# Patient Record
Sex: Female | Born: 1990 | Race: Black or African American | Hispanic: No | Marital: Single | Smoking: Never smoker
Health system: Southern US, Community
[De-identification: ages and names within clinical notes are randomized; demographics above are authoritative.]

## PROBLEM LIST (undated history)

## (undated) ENCOUNTER — Inpatient Hospital Stay (HOSPITAL_COMMUNITY): Payer: Self-pay

## (undated) DIAGNOSIS — J45909 Unspecified asthma, uncomplicated: Secondary | ICD-10-CM

## (undated) DIAGNOSIS — F1290 Cannabis use, unspecified, uncomplicated: Secondary | ICD-10-CM

## (undated) DIAGNOSIS — F319 Bipolar disorder, unspecified: Secondary | ICD-10-CM

## (undated) HISTORY — DX: Bipolar disorder, unspecified: F31.9

## (undated) HISTORY — DX: Cannabis use, unspecified, uncomplicated: F12.90

## (undated) HISTORY — PX: WISDOM TOOTH EXTRACTION: SHX21

---
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
## 2011-09-21 LAB — OB RESULTS CONSOLE RPR: RPR: NONREACTIVE

## 2011-09-21 LAB — OB RESULTS CONSOLE ANTIBODY SCREEN: Antibody Screen: NEGATIVE

## 2011-09-21 LAB — OB RESULTS CONSOLE GC/CHLAMYDIA: Chlamydia: NEGATIVE

## 2012-02-03 LAB — OB RESULTS CONSOLE GBS: GBS: NEGATIVE

## 2012-02-29 ENCOUNTER — Encounter (HOSPITAL_COMMUNITY): Payer: Self-pay

## 2012-02-29 ENCOUNTER — Inpatient Hospital Stay (HOSPITAL_COMMUNITY)
Admission: AD | Admit: 2012-02-29 | Discharge: 2012-02-29 | Disposition: A | Discharge status: Home / Self Care | Payer: Medicaid Other | Source: Ambulatory Visit | Attending: Obstetrics | Admitting: Obstetrics

## 2012-02-29 DIAGNOSIS — R109 Unspecified abdominal pain: Secondary | ICD-10-CM | POA: Insufficient documentation

## 2012-02-29 DIAGNOSIS — O99891 Other specified diseases and conditions complicating pregnancy: Secondary | ICD-10-CM | POA: Insufficient documentation

## 2012-02-29 HISTORY — DX: Unspecified asthma, uncomplicated: J45.909

## 2012-02-29 NOTE — MAU Note (Signed)
Dr. Clearance Coots notified pt in MAU for labor eval, having intermittent lower abdominal pain, noted mucus plug with blood tinge. EFM tracing reactive, cervix 1, long, -1, vertex, membranes intact, orders to d/c home with labor precautions.

## 2012-02-29 NOTE — MAU Note (Signed)
Pt states due date is today, notes mucus plug and blood tinge with mucus. Having pain in lower abdominal area as well. Denies gush of fluid, however has constant urge to void. Lower abd pain is intermittent.

## 2012-03-03 ENCOUNTER — Encounter (HOSPITAL_COMMUNITY): Payer: Self-pay | Admitting: *Deleted

## 2012-03-03 ENCOUNTER — Telehealth (HOSPITAL_COMMUNITY): Payer: Self-pay | Admitting: *Deleted

## 2012-03-03 ENCOUNTER — Other Ambulatory Visit: Payer: Self-pay | Admitting: Obstetrics

## 2012-03-03 NOTE — Telephone Encounter (Signed)
Preadmission screen  

## 2012-03-07 ENCOUNTER — Other Ambulatory Visit: Payer: Self-pay | Admitting: Obstetrics

## 2012-03-08 ENCOUNTER — Encounter (HOSPITAL_COMMUNITY): Payer: Self-pay

## 2012-03-08 ENCOUNTER — Inpatient Hospital Stay (HOSPITAL_COMMUNITY)
Admission: RE | Admit: 2012-03-08 | Discharge: 2012-03-12 | DRG: 766 | Disposition: A | Discharge status: Home / Self Care | Payer: Medicaid Other | Source: Ambulatory Visit | Attending: Obstetrics | Admitting: Obstetrics

## 2012-03-08 VITALS — BP 116/77 | HR 98 | Temp 97.3°F | Resp 20 | Ht 65.0 in | Wt 274.0 lb

## 2012-03-08 DIAGNOSIS — Z98891 History of uterine scar from previous surgery: Secondary | ICD-10-CM

## 2012-03-08 DIAGNOSIS — O3660X0 Maternal care for excessive fetal growth, unspecified trimester, not applicable or unspecified: Secondary | ICD-10-CM | POA: Diagnosis present

## 2012-03-08 DIAGNOSIS — O480 Post-term pregnancy: Principal | ICD-10-CM | POA: Diagnosis present

## 2012-03-08 DIAGNOSIS — IMO0001 Reserved for inherently not codable concepts without codable children: Secondary | ICD-10-CM

## 2012-03-08 LAB — CBC
MCH: 28.9 pg (ref 26.0–34.0)
MCHC: 34 g/dL (ref 30.0–36.0)
MCV: 85 fL (ref 78.0–100.0)
Platelets: 248 10*3/uL (ref 150–400)
RBC: 4.4 MIL/uL (ref 3.87–5.11)
RDW: 12.3 % (ref 11.5–15.5)

## 2012-03-08 LAB — TYPE AND SCREEN: Antibody Screen: NEGATIVE

## 2012-03-08 MED ORDER — OXYTOCIN 40 UNITS IN LACTATED RINGERS INFUSION - SIMPLE MED
1.0000 m[IU]/min | INTRAVENOUS | Status: DC
  Administered 2012-03-08: 1 m[IU]/min via INTRAVENOUS
  Filled 2012-03-08: qty 1000

## 2012-03-08 MED ORDER — NALBUPHINE HCL 10 MG/ML IJ SOLN: 10.0000 mg | INTRAMUSCULAR | Status: DC | PRN

## 2012-03-08 MED ORDER — NALBUPHINE HCL 10 MG/ML IJ SOLN: 10.0000 mg | Freq: Four times a day (QID) | INTRAMUSCULAR | Status: DC | PRN

## 2012-03-08 MED ORDER — IBUPROFEN 600 MG PO TABS: 600.0000 mg | ORAL_TABLET | Freq: Four times a day (QID) | ORAL | Status: DC | PRN

## 2012-03-08 MED ORDER — CITRIC ACID-SODIUM CITRATE 334-500 MG/5ML PO SOLN
30.0000 mL | ORAL | Status: DC | PRN
  Administered 2012-03-09: 30 mL via ORAL
  Filled 2012-03-08: qty 15

## 2012-03-08 MED ORDER — LACTATED RINGERS IV SOLN
INTRAVENOUS | Status: DC
  Administered 2012-03-08 – 2012-03-09 (×4): via INTRAVENOUS
  Administered 2012-03-09 (×2): 125 mL/h via INTRAVENOUS

## 2012-03-08 MED ORDER — PROMETHAZINE HCL 25 MG/ML IJ SOLN
25.0000 mg | Freq: Four times a day (QID) | INTRAMUSCULAR | Status: DC | PRN
  Administered 2012-03-09: 25 mg via INTRAMUSCULAR
  Filled 2012-03-08: qty 1

## 2012-03-08 MED ORDER — OXYTOCIN BOLUS FROM INFUSION
500.0000 mL | Freq: Once | INTRAVENOUS | Status: DC
  Filled 2012-03-08: qty 500

## 2012-03-08 MED ORDER — TERBUTALINE SULFATE 1 MG/ML IJ SOLN: 0.2500 mg | Freq: Once | INTRAMUSCULAR | Status: AC | PRN

## 2012-03-08 MED ORDER — ACETAMINOPHEN 325 MG PO TABS: 650.0000 mg | ORAL_TABLET | ORAL | Status: DC | PRN

## 2012-03-08 MED ORDER — LACTATED RINGERS IV SOLN
500.0000 mL | INTRAVENOUS | Status: DC | PRN
  Administered 2012-03-09: 500 mL via INTRAVENOUS
  Administered 2012-03-09: 200 mL via INTRAVENOUS

## 2012-03-08 MED ORDER — LIDOCAINE HCL (PF) 1 % IJ SOLN: 30.0000 mL | INTRAMUSCULAR | Status: DC | PRN

## 2012-03-08 MED ORDER — OXYTOCIN 40 UNITS IN LACTATED RINGERS INFUSION - SIMPLE MED: 62.5000 mL/h | INTRAVENOUS | Status: DC

## 2012-03-08 MED ORDER — ONDANSETRON HCL 4 MG/2ML IJ SOLN: 4.0000 mg | Freq: Four times a day (QID) | INTRAMUSCULAR | Status: DC | PRN

## 2012-03-08 MED ORDER — OXYCODONE-ACETAMINOPHEN 5-325 MG PO TABS: 1.0000 | ORAL_TABLET | ORAL | Status: DC | PRN

## 2012-03-08 NOTE — H&P (Signed)
Jean Dougherty is a 21 y.o. female presenting for IOL. Maternal Medical History:  Reason for admission: 21 yo G1 EDC 02-29-12.  Presents for IOL for postdates.  Fetal activity: Perceived fetal activity is normal.   Last perceived fetal movement was within the past hour.    Prenatal complications: no prenatal complications Prenatal Complications - Diabetes: none.    OB History    Grav Para Term Preterm Abortions TAB SAB Ect Mult Living   1              Past Medical History  Diagnosis Date  . Asthma    Past Surgical History  Procedure Date  . Wisdom tooth extraction    Family History: family history includes Cancer in her maternal grandmother; Diabetes in her father and maternal grandfather; and Hypertension in her father, maternal grandfather, and maternal uncle.  There is no history of Other. Social History:  reports that she has never smoked. She has never used smokeless tobacco. She reports that she does not drink alcohol or use illicit drugs.   Prenatal Transfer Tool  Maternal Diabetes: No Genetic Screening: Normal Maternal Ultrasounds/Referrals: Normal Fetal Ultrasounds or other Referrals:  None Maternal Substance Abuse:  No Significant Maternal Medications:  None Significant Maternal Lab Results:  Lab values include: Group B Strep negative Other Comments:  None  Review of Systems  All other systems reviewed and are negative.      Blood pressure 121/72, pulse 106, temperature 98.1 F (36.7 C), temperature source Oral, resp. rate 20, height 5\' 5"  (1.651 m), weight 124.286 kg (274 lb). Maternal Exam:  Abdomen: Patient reports no abdominal tenderness. Cervix: Cervix evaluated by digital exam.     Physical Exam  Nursing note and vitals reviewed. Constitutional: She is oriented to person, place, and time. She appears well-developed and well-nourished.  HENT:  Head: Normocephalic and atraumatic.  Eyes: Conjunctivae normal are normal. Pupils are equal, round, and  reactive to light.  Neck: Normal range of motion. Neck supple.  Cardiovascular: Normal rate and regular rhythm.   Respiratory: Effort normal and breath sounds normal.  GI: Soft.  Musculoskeletal: Normal range of motion.  Neurological: She is alert and oriented to person, place, and time.  Skin: Skin is warm and dry.  Psychiatric: She has a normal mood and affect. Her behavior is normal. Judgment and thought content normal.    Prenatal labs: ABO, Rh: A/Positive/-- (05/13 0000) Antibody: Negative (05/13 0000) Rubella: Immune (05/13 0000) RPR: Nonreactive (05/13 0000)  HBsAg: Negative (05/13 0000)  HIV: Non-reactive (05/13 0000)  GBS: Negative (09/25 0000)   Assessment/Plan: Postdates.  Low dose pitocin per protocol.   Sahian Kerney A 03/08/2012, 7:59 AM

## 2012-03-08 NOTE — Progress Notes (Signed)
Jean Dougherty is a 21 y.o. G1P0 at [redacted]w[redacted]d by LMP admitted for induction of labor due to Post dates. Due date 02-29-12.  Subjective:   Objective: BP 131/68  Pulse 88  Temp 98.1 F (36.7 C) (Oral)  Resp 20  Ht 5\' 5"  (1.651 m)  Wt 124.286 kg (274 lb)  BMI 45.60 kg/m2      FHT:  FHR: 150 bpm, variability: moderate,  accelerations:  Present,  decelerations:  Absent UC:   regular, every 3-5 minutes SVE:   Dilation: 2 Effacement (%): 90 Station: -2 Exam by:: [employer]: Lab Results  Component Value Date   WBC 6.1 03/08/2012   HGB 12.7 03/08/2012   HCT 37.4 03/08/2012   MCV 85.0 03/08/2012   PLT 248 03/08/2012    Assessment / Plan: Augmentation of labor, progressing well  Labor: Latent phase. Preeclampsia:  n/a Fetal Wellbeing:  Category I Pain Control:  Labor support without medications I/D:  n/a Anticipated MOD:  NSVD  Rayland Hamed A 03/08/2012, 9:10 AM

## 2012-03-09 ENCOUNTER — Encounter (HOSPITAL_COMMUNITY): Payer: Self-pay | Admitting: Anesthesiology

## 2012-03-09 ENCOUNTER — Inpatient Hospital Stay (HOSPITAL_COMMUNITY): Payer: Medicaid Other | Admitting: Anesthesiology

## 2012-03-09 ENCOUNTER — Encounter (HOSPITAL_COMMUNITY)
Admission: RE | Disposition: A | Discharge status: Home / Self Care | Payer: Self-pay | Source: Ambulatory Visit | Attending: Obstetrics

## 2012-03-09 ENCOUNTER — Encounter (HOSPITAL_COMMUNITY): Payer: Self-pay

## 2012-03-09 DIAGNOSIS — IMO0001 Reserved for inherently not codable concepts without codable children: Secondary | ICD-10-CM

## 2012-03-09 LAB — COMPREHENSIVE METABOLIC PANEL
ALT: 16 U/L (ref 0–35)
Alkaline Phosphatase: 154 U/L — ABNORMAL HIGH (ref 39–117)
BUN: 3 mg/dL — ABNORMAL LOW (ref 6–23)
CO2: 24 mEq/L (ref 19–32)
GFR calc Af Amer: >90 mL/min (ref >90)
GFR calc non Af Amer: >90 mL/min (ref >90)
Glucose, Bld: 116 mg/dL — ABNORMAL HIGH (ref 70–99)
Potassium: 4 mEq/L (ref 3.5–5.1)
Sodium: 136 mEq/L (ref 135–145)
Total Protein: 6.2 g/dL (ref 6.0–8.3)

## 2012-03-09 SURGERY — Surgical Case
Anesthesia: Epidural | Site: Abdomen | Wound class: Clean Contaminated

## 2012-03-09 MED ORDER — LIDOCAINE HCL 1.5 % IJ SOLN
INTRAMUSCULAR | Status: DC | PRN
  Administered 2012-03-09 (×2): 9 mL via INTRADERMAL

## 2012-03-09 MED ORDER — NALBUPHINE SYRINGE 5 MG/0.5 ML
10.0000 mg | INJECTION | Freq: Four times a day (QID) | INTRAMUSCULAR | Status: DC | PRN
  Administered 2012-03-09: 10 mg via INTRAMUSCULAR
  Filled 2012-03-09 (×2): qty 1

## 2012-03-09 MED ORDER — CARBOPROST TROMETHAMINE 250 MCG/ML IM SOLN
INTRAMUSCULAR | Status: AC
  Filled 2012-03-09: qty 1

## 2012-03-09 MED ORDER — HYDROMORPHONE HCL PF 1 MG/ML IJ SOLN
INTRAMUSCULAR | Status: AC
  Filled 2012-03-09: qty 1

## 2012-03-09 MED ORDER — KETOROLAC TROMETHAMINE 60 MG/2ML IM SOLN
INTRAMUSCULAR | Status: AC
  Filled 2012-03-09: qty 2

## 2012-03-09 MED ORDER — MEPERIDINE HCL 25 MG/ML IJ SOLN: 6.2500 mg | INTRAMUSCULAR | Status: DC | PRN

## 2012-03-09 MED ORDER — LIDOCAINE-EPINEPHRINE (PF) 2 %-1:200000 IJ SOLN
INTRAMUSCULAR | Status: AC
  Filled 2012-03-09: qty 20

## 2012-03-09 MED ORDER — LACTATED RINGERS IV SOLN
500.0000 mL | Freq: Once | INTRAVENOUS | Status: AC
  Administered 2012-03-09: 500 mL via INTRAVENOUS

## 2012-03-09 MED ORDER — OXYTOCIN 10 UNIT/ML IJ SOLN
INTRAMUSCULAR | Status: AC
  Filled 2012-03-09: qty 4

## 2012-03-09 MED ORDER — SCOPOLAMINE 1 MG/3DAYS TD PT72
MEDICATED_PATCH | TRANSDERMAL | Status: AC
  Filled 2012-03-09: qty 1

## 2012-03-09 MED ORDER — ONDANSETRON HCL 4 MG/2ML IJ SOLN
INTRAMUSCULAR | Status: DC | PRN
  Administered 2012-03-09: 4 mg via INTRAVENOUS

## 2012-03-09 MED ORDER — PROMETHAZINE HCL 25 MG/ML IJ SOLN: 6.2500 mg | INTRAMUSCULAR | Status: DC | PRN

## 2012-03-09 MED ORDER — FENTANYL 2.5 MCG/ML BUPIVACAINE 1/10 % EPIDURAL INFUSION (WH - ANES)
14.0000 mL/h | INTRAMUSCULAR | Status: DC
  Administered 2012-03-09 (×2): 14 mL/h via EPIDURAL
  Filled 2012-03-09 (×3): qty 125

## 2012-03-09 MED ORDER — FENTANYL 2.5 MCG/ML BUPIVACAINE 1/10 % EPIDURAL INFUSION (WH - ANES)
INTRAMUSCULAR | Status: DC | PRN
  Administered 2012-03-09: 14 mL/h via EPIDURAL

## 2012-03-09 MED ORDER — SODIUM CHLORIDE 0.9 % IV SOLN
3.0000 g | Freq: Once | INTRAVENOUS | Status: DC
  Filled 2012-03-09: qty 3

## 2012-03-09 MED ORDER — MORPHINE SULFATE (PF) 0.5 MG/ML IJ SOLN
INTRAMUSCULAR | Status: DC | PRN
  Administered 2012-03-09: 3 mg via EPIDURAL

## 2012-03-09 MED ORDER — DEXTROSE 5 % IV SOLN
3.0000 g | Freq: Once | INTRAVENOUS | Status: AC
  Administered 2012-03-09: 3 g via INTRAVENOUS
  Filled 2012-03-09: qty 3000

## 2012-03-09 MED ORDER — SODIUM BICARBONATE 8.4 % IV SOLN
INTRAVENOUS | Status: DC | PRN
  Administered 2012-03-09 (×2): 5 mL via EPIDURAL

## 2012-03-09 MED ORDER — NALBUPHINE SYRINGE 5 MG/0.5 ML
10.0000 mg | INJECTION | INTRAMUSCULAR | Status: DC | PRN
  Administered 2012-03-09: 10 mg via INTRAVENOUS
  Filled 2012-03-09 (×2): qty 1

## 2012-03-09 MED ORDER — DEXAMETHASONE SODIUM PHOSPHATE 10 MG/ML IJ SOLN
INTRAMUSCULAR | Status: DC | PRN
  Administered 2012-03-09: 10 mg via INTRAVENOUS

## 2012-03-09 MED ORDER — PHENYLEPHRINE 40 MCG/ML (10ML) SYRINGE FOR IV PUSH (FOR BLOOD PRESSURE SUPPORT)
80.0000 ug | PREFILLED_SYRINGE | INTRAVENOUS | Status: DC | PRN
  Filled 2012-03-09: qty 5

## 2012-03-09 MED ORDER — SCOPOLAMINE 1 MG/3DAYS TD PT72
1.0000 | MEDICATED_PATCH | Freq: Once | TRANSDERMAL | Status: DC
  Administered 2012-03-09: 1.5 mg via TRANSDERMAL

## 2012-03-09 MED ORDER — KETOROLAC TROMETHAMINE 30 MG/ML IJ SOLN: 15.0000 mg | Freq: Once | INTRAMUSCULAR | Status: AC | PRN

## 2012-03-09 MED ORDER — HYDROMORPHONE HCL PF 1 MG/ML IJ SOLN
0.2500 mg | INTRAMUSCULAR | Status: DC | PRN
  Administered 2012-03-09: 0.5 mg via INTRAVENOUS

## 2012-03-09 MED ORDER — KETOROLAC TROMETHAMINE 60 MG/2ML IM SOLN
60.0000 mg | Freq: Once | INTRAMUSCULAR | Status: AC | PRN
  Administered 2012-03-09: 60 mg via INTRAMUSCULAR

## 2012-03-09 MED ORDER — ONDANSETRON HCL 4 MG/2ML IJ SOLN
INTRAMUSCULAR | Status: AC
  Filled 2012-03-09: qty 2

## 2012-03-09 MED ORDER — PHENYLEPHRINE 40 MCG/ML (10ML) SYRINGE FOR IV PUSH (FOR BLOOD PRESSURE SUPPORT): 80.0000 ug | PREFILLED_SYRINGE | INTRAVENOUS | Status: DC | PRN

## 2012-03-09 MED ORDER — ENOXAPARIN SODIUM 40 MG/0.4ML ~~LOC~~ SOLN
40.0000 mg | SUBCUTANEOUS | Status: DC
  Filled 2012-03-09: qty 0.4

## 2012-03-09 MED ORDER — MORPHINE SULFATE (PF) 0.5 MG/ML IJ SOLN
INTRAMUSCULAR | Status: DC | PRN
  Administered 2012-03-09: 2 mg via INTRAVENOUS

## 2012-03-09 MED ORDER — MORPHINE SULFATE 0.5 MG/ML IJ SOLN
INTRAMUSCULAR | Status: AC
  Filled 2012-03-09: qty 10

## 2012-03-09 MED ORDER — LACTATED RINGERS IV SOLN
INTRAVENOUS | Status: DC | PRN
  Administered 2012-03-09 (×3): via INTRAVENOUS

## 2012-03-09 MED ORDER — DEXAMETHASONE SODIUM PHOSPHATE 10 MG/ML IJ SOLN
INTRAMUSCULAR | Status: AC
  Filled 2012-03-09: qty 1

## 2012-03-09 MED ORDER — SODIUM BICARBONATE 8.4 % IV SOLN
INTRAVENOUS | Status: AC
  Filled 2012-03-09: qty 50

## 2012-03-09 MED ORDER — DIPHENHYDRAMINE HCL 50 MG/ML IJ SOLN
12.5000 mg | INTRAMUSCULAR | Status: DC | PRN
  Administered 2012-03-09: 12.5 mg via INTRAVENOUS
  Filled 2012-03-09: qty 1

## 2012-03-09 MED ORDER — EPHEDRINE 5 MG/ML INJ
10.0000 mg | INTRAVENOUS | Status: DC | PRN
  Filled 2012-03-09: qty 4

## 2012-03-09 MED ORDER — EPHEDRINE 5 MG/ML INJ: 10.0000 mg | INTRAVENOUS | Status: DC | PRN

## 2012-03-09 MED ORDER — CARBOPROST TROMETHAMINE 250 MCG/ML IM SOLN
INTRAMUSCULAR | Status: DC | PRN
  Administered 2012-03-09: 250 ug via INTRAMUSCULAR

## 2012-03-09 MED ORDER — LACTATED RINGERS IV SOLN
40.0000 [IU] | INTRAVENOUS | Status: DC | PRN
  Administered 2012-03-09: 40 [IU] via INTRAVENOUS

## 2012-03-09 SURGICAL SUPPLY — 45 items
APL SKNCLS STERI-STRIP NONHPOA (GAUZE/BANDAGES/DRESSINGS)
BENZOIN TINCTURE PRP APPL 2/3 (GAUZE/BANDAGES/DRESSINGS) ×1 IMPLANT
CANISTER WOUND CARE 500ML ATS (WOUND CARE) IMPLANT
CLOTH BEACON ORANGE TIMEOUT ST (SAFETY) ×2 IMPLANT
CONTAINER PREFILL 10% NBF 15ML (MISCELLANEOUS) IMPLANT
DRAPE SURG 17X23 STRL (DRAPES) ×1 IMPLANT
DRESSING TELFA 8X3 (GAUZE/BANDAGES/DRESSINGS) ×1 IMPLANT
DRSG COVADERM 4X10 (GAUZE/BANDAGES/DRESSINGS) ×1 IMPLANT
DRSG VAC ATS LRG SENSATRAC (GAUZE/BANDAGES/DRESSINGS) IMPLANT
DRSG VAC ATS MED SENSATRAC (GAUZE/BANDAGES/DRESSINGS) IMPLANT
DRSG VAC ATS SM SENSATRAC (GAUZE/BANDAGES/DRESSINGS) IMPLANT
DURAPREP 26ML APPLICATOR (WOUND CARE) ×2 IMPLANT
ELECT REM PT RETURN 9FT ADLT (ELECTROSURGICAL) ×2
ELECTRODE REM PT RTRN 9FT ADLT (ELECTROSURGICAL) ×1 IMPLANT
EXTRACTOR VACUUM M CUP 4 TUBE (SUCTIONS) IMPLANT
GAUZE SPONGE 4X4 12PLY STRL LF (GAUZE/BANDAGES/DRESSINGS) ×2 IMPLANT
GLOVE BIO SURGEON STRL SZ 6.5 (GLOVE) ×4 IMPLANT
GOWN PREVENTION PLUS LG XLONG (DISPOSABLE) ×5 IMPLANT
KIT ABG SYR 3ML LUER SLIP (SYRINGE) IMPLANT
NDL HYPO 25X5/8 SAFETYGLIDE (NEEDLE) ×1 IMPLANT
NEEDLE HYPO 25X5/8 SAFETYGLIDE (NEEDLE) IMPLANT
NS IRRIG 1000ML POUR BTL (IV SOLUTION) ×2 IMPLANT
PACK C SECTION WH (CUSTOM PROCEDURE TRAY) ×2 IMPLANT
PAD ABD 7.5X8 STRL (GAUZE/BANDAGES/DRESSINGS) ×2 IMPLANT
PAD OB MATERNITY 4.3X12.25 (PERSONAL CARE ITEMS) ×1 IMPLANT
RTRCTR C-SECT PINK 25CM LRG (MISCELLANEOUS) ×1 IMPLANT
SLEEVE SCD COMPRESS KNEE MED (MISCELLANEOUS) ×1 IMPLANT
STAPLER VISISTAT 35W (STAPLE) IMPLANT
STRIP CLOSURE SKIN 1/2X4 (GAUZE/BANDAGES/DRESSINGS) ×1 IMPLANT
SUT MNCRL 0 VIOLET CTX 36 (SUTURE) ×2 IMPLANT
SUT MNCRL AB 3-0 PS2 27 (SUTURE) IMPLANT
SUT MONOCRYL 0 CTX 36 (SUTURE) ×3
SUT PDS AB 0 CTX 36 PDP370T (SUTURE) ×2 IMPLANT
SUT PLAIN 0 NONE (SUTURE) IMPLANT
SUT PLAIN 2 0 XLH (SUTURE) ×2 IMPLANT
SUT VIC AB 0 CTXB 36 (SUTURE) IMPLANT
SUT VIC AB 2-0 CT1 (SUTURE) ×2 IMPLANT
SUT VIC AB 2-0 CT1 27 (SUTURE) ×2
SUT VIC AB 2-0 CT1 TAPERPNT 27 (SUTURE) ×1 IMPLANT
SUT VIC AB 2-0 SH 27 (SUTURE)
SUT VIC AB 2-0 SH 27XBRD (SUTURE) IMPLANT
TAPE CLOTH SURG 4X10 WHT LF (GAUZE/BANDAGES/DRESSINGS) ×1 IMPLANT
TOWEL OR 17X24 6PK STRL BLUE (TOWEL DISPOSABLE) ×4 IMPLANT
TRAY FOLEY CATH 14FR (SET/KITS/TRAYS/PACK) ×2 IMPLANT
WATER STERILE IRR 1000ML POUR (IV SOLUTION) ×1 IMPLANT

## 2012-03-09 NOTE — Consult Note (Signed)
Neonatology Note:  Attendance at C-section:  I was asked to attend this primary C/S at 41 2/7 weeks due to FTP, failed induction. The mother is a G1P0 A pos, GBS neg with mild PIH and obesity. ROM 14 hours prior to delivery, fluid clear. Infant vigorous with good spontaneous cry and tone. Needed no bulb suctioning. Ap 9/9. Lungs clear to ausc in DR. Marked molding of the head noted. To CN to care of Pediatrician.  Walther Sanagustin, MD  

## 2012-03-09 NOTE — Progress Notes (Signed)
DORTHIE SANTINI is a 21 y.o. G1P0000 at [redacted]w[redacted]d by LMP admitted for induction of labor due to Post dates.   Subjective: Comfortable  Objective: BP 162/76  Pulse 106  Temp 98 F (36.7 C) (Oral)  Resp 18  Ht 5\' 5"  (1.651 m)  Wt 124.286 kg (274 lb)  BMI 45.60 kg/m2  SpO2 100% I/O last 3 completed shifts: In: -  Out: 1150 [Urine:1150] Total I/O In: -  Out: 1500 [Urine:1500]  FHT:  FHR: 140 bpm, variability: moderate,  accelerations:  Present,  decelerations:  Absent UC:   irregular, every 5 minutes SVE:   Dilation: 4.5 Effacement (%): 90 Station: -1 Exam by:: Dr. Tamela Oddi  Labs: Lab Results  Component Value Date   WBC 6.1 03/08/2012   HGB 12.7 03/08/2012   HCT 37.4 03/08/2012   MCV 85.0 03/08/2012   PLT 248 03/08/2012    Assessment / Plan: Protracted latent phase Inadequate MVU; coupling--dysfunctional pattern ?LGA  Labor: see above Preeclampsia:  B/Ps labile; labs OK; no symptoms Fetal Wellbeing:  Category I Pain Control:  Epidural I/D:  n/a Anticipated MOD:  C/D  JACKSON-MOORE,Stefan Karen A 03/09/2012, 8:31 PM

## 2012-03-09 NOTE — Anesthesia Postprocedure Evaluation (Signed)
Anesthesia Post Note  Patient: Jean Dougherty  Procedure(s) Performed: Procedure(s) (LRB): CESAREAN SECTION (N/A)  Anesthesia type: Epidural  Patient location: PACU  Post pain: Pain level controlled  Post assessment: Post-op Vital signs reviewed  Last Vitals:  Filed Vitals:   03/09/12 2020  BP: 138/89  Pulse: 122  Temp:   Resp:     Post vital signs: Reviewed  Level of consciousness: awake  Complications: No apparent anesthesia complications

## 2012-03-09 NOTE — Transfer of Care (Signed)
Immediate Anesthesia Transfer of Care Note  Patient: Jean Dougherty  Procedure(s) Performed: Procedure(s) (LRB) with comments: CESAREAN SECTION (N/A)  Patient Location: PACU  Anesthesia Type:Epidural  Level of Consciousness: awake  Airway & Oxygen Therapy: Patient Spontanous Breathing  Post-op Assessment: Report given to PACU RN and Post -op Vital signs reviewed and stable  Post vital signs: stable  Complications: No apparent anesthesia complications

## 2012-03-09 NOTE — Op Note (Signed)
Cesarean Section Procedure Note   Jean Dougherty   03/08/2012 - 03/09/2012  Indications: Protracted latent phase, failed induction of labor attempt   Pre-operative Diagnosis: Protracted latent phase, failed induction of labor attempt  Post-operative Diagnosis: Same   Surgeon: Antionette Char A  Assistants: none  Anesthesia: epidural  Procedure Details:  The patient was seen in the Holding Room. The risks, benefits, complications, treatment options, and expected outcomes were discussed with the patient. The patient concurred with the proposed plan, giving informed consent. The patient was identified as Jean Dougherty and the procedure verified as C-Section Delivery. A Time Out was held and the above information confirmed.  After induction of anesthesia, the patient was draped and prepped in the usual sterile manner. A transverse incision was made and carried down through the subcutaneous tissue to the fascia. The fascial incision was made and extended transversely. The fascia was separated from the underlying rectus tissue superiorly and inferiorly. The peritoneum was identified and entered. The peritoneal incision was extended longitudinally. The utero-vesical peritoneal reflection was incised transversely and the bladder flap was bluntly freed from the lower uterine segment. A low transverse uterine incision was made. Delivered from cephalic presentation was a  living newborn female infant(s). APGAR (1 MIN): 9   APGAR (5 MINS): 9      A cord ph was not sent. The umbilical cord was clamped and cut cord. A sample was obtained for evaluation. The placenta was removed Intact and appeared normal.  The uterus was exteriorized.  There was mild uterine atony that responed to fundal massage an IM hemabate.  The uterine incision was closed with running locked sutures of 1-0 Monocryl. A second imbricating layer of the same suture was placed.  Hemostasis was observed. The paracolic gutters were  irrigated. The parieto peritoneum was closed in a running fashion with 2-0 Vicryl.  The fascia was then reapproximated with running sutures of 0 PDS.  Interrupted 0 Plain sutures were placed in the subcutaneous layer.  The skin was closed with staples.  Instrument, sponge, and needle counts were correct prior the abdominal closure and were correct at the conclusion of the case.    Findings:  The position of the fetal head was occiput posterior.  There was significant molding.   Estimated Blood Loss: 600 ml  Total IV Fluids: per Anesthesiology  Urine Output: per Anesthesiology  Specimens: none  Complications: no complications  Disposition: PACU - hemodynamically stable.  Maternal Condition: stable   Baby condition / location:  nursery-stable    Signed: Surgeon(s): Antionette Char, MD

## 2012-03-09 NOTE — Progress Notes (Signed)
Orders received to stop pitocin for 2 hours then restart at 51mu/min.  Will continue to monitor.

## 2012-03-09 NOTE — Anesthesia Procedure Notes (Signed)
Epidural Patient location during procedure: OB Start time: 03/09/2012 5:27 AM End time: 03/09/2012 5:32 AM  Staffing Anesthesiologist: Sandrea Hughs Performed by: anesthesiologist   Preanesthetic Checklist Completed: patient identified, site marked, surgical consent, pre-op evaluation, timeout performed, IV checked, risks and benefits discussed and monitors and equipment checked  Epidural Patient position: sitting Prep: site prepped and draped and DuraPrep Patient monitoring: continuous pulse ox and blood pressure Approach: midline Injection technique: LOR air  Needle:  Needle type: Tuohy  Needle gauge: 17 G Needle length: 9 cm and 9 Needle insertion depth: 9 cm Catheter type: closed end flexible Catheter size: 19 Gauge Catheter at skin depth: 15 cm Test dose: negative and Other  Assessment Sensory level: T8 Events: blood not aspirated, injection not painful, no injection resistance, negative IV test and no paresthesia  Additional Notes Reason for block:procedure for pain

## 2012-03-09 NOTE — Progress Notes (Signed)
Jean Dougherty is a 21 y.o. G1P0000 at [redacted]w[redacted]d by LMP admitted for induction of labor due to Post dates.   Subjective: Comfortable  Objective: BP 125/55  Pulse 106  Temp 98.2 F (36.8 C) (Oral)  Resp 18  Ht 5\' 5"  (1.651 m)  Wt 124.286 kg (274 lb)  BMI 45.60 kg/m2  SpO2 100%   Total I/O In: -  Out: 250 [Urine:250]  FHT:  FHR: 140 bpm, variability: moderate,  accelerations:  Present,  decelerations:  Absent UC:   irregular, every 5 minutes SVE:   Dilation: 4.5 Effacement (%): 100 Station: -1 Exam by:: Valentina Lucks, RN  Labs: Lab Results  Component Value Date   WBC 6.1 03/08/2012   HGB 12.7 03/08/2012   HCT 37.4 03/08/2012   MCV 85.0 03/08/2012   PLT 248 03/08/2012    Assessment / Plan: Induction of labor due to postterm,  progressing well on pitocin  Labor: Progressing normally Preeclampsia:  B/Ps OK; check labs Fetal Wellbeing:  Category I Pain Control:  Epidural I/D:  n/a Anticipated MOD:  NSVD  JACKSON-MOORE,Terryl Molinelli A 03/09/2012, 8:52 AM

## 2012-03-09 NOTE — Progress Notes (Signed)
Jean Dougherty is a 21 y.o. G1P0000 at [redacted]w[redacted]d by LMP admitted for induction of labor due to Post dates. Due date 02-29-12.  Subjective:   Objective: BP 144/81  Pulse 101  Temp 98.3 F (36.8 C) (Oral)  Resp 18  Ht 5\' 5"  (1.651 m)  Wt 124.286 kg (274 lb)  BMI 45.60 kg/m2      FHT:  FHR: 150-160 bpm, variability: moderate,  accelerations:  Present,  decelerations:  Absent UC:   regular, every 3 minutes SVE:   Dilation: 3 Effacement (%): 100 Station: -2 Exam by:: N Deal RN  Labs: Lab Results  Component Value Date   WBC 6.1 03/08/2012   HGB 12.7 03/08/2012   HCT 37.4 03/08/2012   MCV 85.0 03/08/2012   PLT 248 03/08/2012    Assessment / Plan: Postdates.  Latent phase.  Continue pitocin per low dose protocol.  Labor: Latent phase. Preeclampsia:  n/a Fetal Wellbeing:  Category I Pain Control:  Nubain I/D:  n/a Anticipated MOD:  NSVD  Jean Dougherty A 03/09/2012, 1:50 AM

## 2012-03-09 NOTE — Anesthesia Preprocedure Evaluation (Signed)
Anesthesia Evaluation  Patient identified by MRN, date of birth, ID band Patient awake    Reviewed: Allergy & Precautions, H&P , NPO status , Patient's Chart, lab work & pertinent test results  Airway Mallampati: III TM Distance: >3 FB Neck ROM: full    Dental No notable dental hx.    Pulmonary neg pulmonary ROS,    Pulmonary exam normal       Cardiovascular negative cardio ROS      Neuro/Psych negative neurological ROS  negative psych ROS   GI/Hepatic negative GI ROS, Neg liver ROS,   Endo/Other  Morbid obesity  Renal/GU negative Renal ROS  negative genitourinary   Musculoskeletal negative musculoskeletal ROS (+)   Abdominal (+) + obese,   Peds negative pediatric ROS (+)  Hematology negative hematology ROS (+)   Anesthesia Other Findings   Reproductive/Obstetrics (+) Pregnancy                           Anesthesia Physical Anesthesia Plan  ASA: III  Anesthesia Plan: Epidural   Post-op Pain Management:    Induction:   Airway Management Planned:   Additional Equipment:   Intra-op Plan:   Post-operative Plan:   Informed Consent: I have reviewed the patients History and Physical, chart, labs and discussed the procedure including the risks, benefits and alternatives for the proposed anesthesia with the patient or authorized representative who has indicated his/her understanding and acceptance.     Plan Discussed with:   Anesthesia Plan Comments:         Anesthesia Quick Evaluation  

## 2012-03-10 ENCOUNTER — Encounter (HOSPITAL_COMMUNITY): Payer: Self-pay

## 2012-03-10 LAB — COMPREHENSIVE METABOLIC PANEL
ALT: 12 U/L (ref 0–35)
AST: 25 U/L (ref 0–37)
Calcium: 9.1 mg/dL (ref 8.4–10.5)
Sodium: 130 mEq/L — ABNORMAL LOW (ref 135–145)
Total Protein: 5.6 g/dL — ABNORMAL LOW (ref 6.0–8.3)

## 2012-03-10 LAB — CBC
MCH: 30.6 pg (ref 26.0–34.0)
MCHC: 35.7 g/dL (ref 30.0–36.0)
Platelets: 204 10*3/uL (ref 150–400)
RBC: 3.56 MIL/uL — ABNORMAL LOW (ref 3.87–5.11)

## 2012-03-10 MED ORDER — NALBUPHINE SYRINGE 5 MG/0.5 ML
5.0000 mg | INJECTION | INTRAMUSCULAR | Status: DC | PRN
  Filled 2012-03-10: qty 1

## 2012-03-10 MED ORDER — DIPHENHYDRAMINE HCL 25 MG PO CAPS: 25.0000 mg | ORAL_CAPSULE | Freq: Four times a day (QID) | ORAL | Status: DC | PRN

## 2012-03-10 MED ORDER — DIPHENHYDRAMINE HCL 50 MG/ML IJ SOLN: 25.0000 mg | INTRAMUSCULAR | Status: DC | PRN

## 2012-03-10 MED ORDER — MEASLES, MUMPS & RUBELLA VAC ~~LOC~~ INJ
0.5000 mL | INJECTION | Freq: Once | SUBCUTANEOUS | Status: DC
  Filled 2012-03-10: qty 0.5

## 2012-03-10 MED ORDER — PRENATAL MULTIVITAMIN CH
1.0000 | ORAL_TABLET | Freq: Every day | ORAL | Status: DC
  Administered 2012-03-10 – 2012-03-12 (×3): 1 via ORAL
  Filled 2012-03-10 (×3): qty 1

## 2012-03-10 MED ORDER — NALBUPHINE SYRINGE 5 MG/0.5 ML
5.0000 mg | INJECTION | INTRAMUSCULAR | Status: DC | PRN
  Administered 2012-03-10: 5 mg via INTRAVENOUS
  Filled 2012-03-10 (×2): qty 1

## 2012-03-10 MED ORDER — IBUPROFEN 600 MG PO TABS
600.0000 mg | ORAL_TABLET | Freq: Four times a day (QID) | ORAL | Status: DC
  Administered 2012-03-10 – 2012-03-12 (×10): 600 mg via ORAL
  Filled 2012-03-10 (×10): qty 1

## 2012-03-10 MED ORDER — KETOROLAC TROMETHAMINE 30 MG/ML IJ SOLN: 30.0000 mg | Freq: Four times a day (QID) | INTRAMUSCULAR | Status: AC | PRN

## 2012-03-10 MED ORDER — TETANUS-DIPHTH-ACELL PERTUSSIS 5-2.5-18.5 LF-MCG/0.5 IM SUSP
0.5000 mL | Freq: Once | INTRAMUSCULAR | Status: AC
  Administered 2012-03-10: 0.5 mL via INTRAMUSCULAR

## 2012-03-10 MED ORDER — ENOXAPARIN SODIUM 40 MG/0.4ML ~~LOC~~ SOLN
40.0000 mg | SUBCUTANEOUS | Status: DC
  Filled 2012-03-10 (×2): qty 0.4

## 2012-03-10 MED ORDER — DIPHENHYDRAMINE HCL 25 MG PO CAPS
25.0000 mg | ORAL_CAPSULE | ORAL | Status: DC | PRN
  Administered 2012-03-10: 25 mg via ORAL
  Filled 2012-03-10: qty 1

## 2012-03-10 MED ORDER — OXYTOCIN 40 UNITS IN LACTATED RINGERS INFUSION - SIMPLE MED: 62.5000 mL/h | INTRAVENOUS | Status: AC

## 2012-03-10 MED ORDER — DEXTROSE IN LACTATED RINGERS 5 % IV SOLN
INTRAVENOUS | Status: DC
  Administered 2012-03-10: 05:00:00 via INTRAVENOUS

## 2012-03-10 MED ORDER — ONDANSETRON HCL 4 MG/2ML IJ SOLN: 4.0000 mg | INTRAMUSCULAR | Status: DC | PRN

## 2012-03-10 MED ORDER — SODIUM CHLORIDE 0.9 % IJ SOLN: 3.0000 mL | INTRAMUSCULAR | Status: DC | PRN

## 2012-03-10 MED ORDER — SIMETHICONE 80 MG PO CHEW
80.0000 mg | CHEWABLE_TABLET | ORAL | Status: DC | PRN
  Administered 2012-03-10 – 2012-03-11 (×4): 80 mg via ORAL

## 2012-03-10 MED ORDER — DIBUCAINE 1 % RE OINT: 1.0000 "application " | TOPICAL_OINTMENT | RECTAL | Status: DC | PRN

## 2012-03-10 MED ORDER — WITCH HAZEL-GLYCERIN EX PADS: 1.0000 "application " | MEDICATED_PAD | CUTANEOUS | Status: DC | PRN

## 2012-03-10 MED ORDER — FERROUS SULFATE 325 (65 FE) MG PO TABS
325.0000 mg | ORAL_TABLET | Freq: Two times a day (BID) | ORAL | Status: DC
  Administered 2012-03-10 – 2012-03-12 (×4): 325 mg via ORAL
  Filled 2012-03-10 (×3): qty 1

## 2012-03-10 MED ORDER — LANOLIN HYDROUS EX OINT: 1.0000 "application " | TOPICAL_OINTMENT | CUTANEOUS | Status: DC | PRN

## 2012-03-10 MED ORDER — ONDANSETRON HCL 4 MG/2ML IJ SOLN: 4.0000 mg | Freq: Three times a day (TID) | INTRAMUSCULAR | Status: DC | PRN

## 2012-03-10 MED ORDER — DIPHENHYDRAMINE HCL 50 MG/ML IJ SOLN
12.5000 mg | INTRAMUSCULAR | Status: DC | PRN
  Administered 2012-03-10: 12.5 mg via INTRAVENOUS
  Filled 2012-03-10: qty 1

## 2012-03-10 MED ORDER — PNEUMOCOCCAL VAC POLYVALENT 25 MCG/0.5ML IJ INJ
0.5000 mL | INJECTION | INTRAMUSCULAR | Status: AC
  Administered 2012-03-10: 0.5 mL via INTRAMUSCULAR
  Filled 2012-03-10: qty 0.5

## 2012-03-10 MED ORDER — OXYCODONE-ACETAMINOPHEN 5-325 MG PO TABS
1.0000 | ORAL_TABLET | ORAL | Status: DC | PRN
  Administered 2012-03-11 – 2012-03-12 (×6): 1 via ORAL
  Filled 2012-03-10 (×6): qty 1

## 2012-03-10 MED ORDER — ONDANSETRON HCL 4 MG PO TABS: 4.0000 mg | ORAL_TABLET | ORAL | Status: DC | PRN

## 2012-03-10 MED ORDER — MAGNESIUM HYDROXIDE 400 MG/5ML PO SUSP: 30.0000 mL | ORAL | Status: DC | PRN

## 2012-03-10 MED ORDER — METOCLOPRAMIDE HCL 5 MG/ML IJ SOLN: 10.0000 mg | Freq: Three times a day (TID) | INTRAMUSCULAR | Status: DC | PRN

## 2012-03-10 MED ORDER — NALOXONE HCL 0.4 MG/ML IJ SOLN: 0.4000 mg | INTRAMUSCULAR | Status: DC | PRN

## 2012-03-10 MED ORDER — SENNOSIDES-DOCUSATE SODIUM 8.6-50 MG PO TABS
2.0000 | ORAL_TABLET | Freq: Every day | ORAL | Status: DC
  Administered 2012-03-10 – 2012-03-11 (×2): 2 via ORAL

## 2012-03-10 MED ORDER — SODIUM CHLORIDE 0.9 % IV SOLN
1.0000 ug/kg/h | INTRAVENOUS | Status: DC | PRN
  Filled 2012-03-10: qty 2.5

## 2012-03-10 MED ORDER — LACTATED RINGERS IV SOLN: INTRAVENOUS | Status: DC

## 2012-03-10 MED ORDER — ZOLPIDEM TARTRATE 5 MG PO TABS: 5.0000 mg | ORAL_TABLET | Freq: Every evening | ORAL | Status: DC | PRN

## 2012-03-10 NOTE — Addendum Note (Signed)
Addendum  created 03/10/12 8119 by Suella Grove, CRNA   Modules edited:Notes Section

## 2012-03-10 NOTE — Progress Notes (Signed)
Subjective: Postpartum Day 1: Cesarean Delivery Patient reports incisional pain and tolerating PO.    Objective: Vital signs in last 24 hours: Temp:  [97.4 F (36.3 C)-98.9 F (37.2 C)] 98.3 F (36.8 C) (10/31 0500) Pulse Rate:  [92-143] 105  (10/31 0500) Resp:  [18-25] 20  (10/31 0500) BP: (104-166)/(30-117) 120/77 mmHg (10/31 0500) SpO2:  [95 %-100 %] 99 % (10/31 0500)  Physical Exam:  General: alert and no distress Lochia: appropriate Uterine Fundus: firm Incision: healing well DVT Evaluation: No evidence of DVT seen on physical exam.   Basename 03/10/12 0509 03/08/12 0740  HGB 10.9* 12.7  HCT 30.5* 37.4    Assessment/Plan: Status post Cesarean section. Doing well postoperatively.  Continue current care.  Trevino Wyatt A 03/10/2012, 6:04 AM

## 2012-03-10 NOTE — Anesthesia Postprocedure Evaluation (Signed)
  Anesthesia Post-op Note  Patient: Jean Dougherty  Procedure(s) Performed: Procedure(s) (LRB) with comments: CESAREAN SECTION (N/A)  Patient Location: Mother/Baby  Anesthesia Type:Epidural  Level of Consciousness: awake, alert  and oriented  Airway and Oxygen Therapy: Patient Spontanous Breathing  Post-op Pain: none  Post-op Assessment: Patient's Cardiovascular Status Stable, Respiratory Function Stable, Patent Airway, No signs of Nausea or vomiting, Adequate PO intake, Pain level controlled, No headache, No backache, No residual numbness and No residual motor weakness  Post-op Vital Signs: Reviewed and stable  Complications: No apparent anesthesia complications

## 2012-03-10 NOTE — Progress Notes (Signed)
UR Chart review completed.  

## 2012-03-11 MED ORDER — INFLUENZA VIRUS VACC SPLIT PF IM SUSP
0.5000 mL | INTRAMUSCULAR | Status: AC
  Administered 2012-03-12: 0.5 mL via INTRAMUSCULAR
  Filled 2012-03-11: qty 0.5

## 2012-03-11 NOTE — Progress Notes (Signed)
Subjective: Postpartum Day 2: Cesarean Delivery Patient reports incisional pain, tolerating PO, + flatus and no problems voiding.    Objective: Vital signs in last 24 hours: Temp:  [97.6 F (36.4 C)-98.4 F (36.9 C)] 98.4 F (36.9 C) (11/01 0643) Pulse Rate:  [68-110] 68  (11/01 0643) Resp:  [18-20] 18  (11/01 0643) BP: (95-134)/(62-75) 134/68 mmHg (11/01 0643) SpO2:  [97 %-100 %] 100 % (10/31 2209)  Physical Exam:  General: alert and no distress Lochia: appropriate Uterine Fundus: firm Incision: healing well DVT Evaluation: No evidence of DVT seen on physical exam.   Basename 03/10/12 0509  HGB 10.9*  HCT 30.5*    Assessment/Plan: Status post Cesarean section. Doing well postoperatively.  Continue current care.  Millena Callins A 03/11/2012, 8:40 AM

## 2012-03-12 NOTE — Discharge Summary (Signed)
Obstetric Discharge Summary Reason for Admission: onset of labor Prenatal Procedures: none Intrapartum Procedures: cesarean: low cervical, transverse Postpartum Procedures: none Complications-Operative and Postpartum: none Hemoglobin  Date Value Range Status  03/10/2012 10.9* 12.0 - 15.0 g/dL Final     HCT  Date Value Range Status  03/10/2012 30.5* 36.0 - 46.0 % Final    Physical Exam:  General: alert Lochia: appropriate Uterine Fundus: firm Incision: healing well DVT Evaluation: No evidence of DVT seen on physical exam.  Discharge Diagnoses: Term Pregnancy-delivered  Discharge Information: Date: 03/12/2012 Activity: pelvic rest Diet: routine Medications: Percocet Condition: stable Instructions: refer to practice specific booklet Discharge to: home Follow-up Information    Call in 6 weeks to follow up.         Newborn Data: Live born female  Birth Weight: 7 lb 6.9 oz (3370 g) APGAR: 9, 9  Home with mother.  MARSHALL,BERNARD A 03/12/2012, 6:58 AM

## 2012-10-27 ENCOUNTER — Encounter: Payer: Self-pay | Admitting: Obstetrics & Gynecology

## 2014-02-12 ENCOUNTER — Other Ambulatory Visit (INDEPENDENT_AMBULATORY_CARE_PROVIDER_SITE_OTHER): Payer: Medicaid Other

## 2014-02-12 VITALS — BP 128/63 | HR 79 | Temp 98.0°F | Ht 64.5 in | Wt 188.0 lb

## 2014-02-12 DIAGNOSIS — Z3200 Encounter for pregnancy test, result unknown: Secondary | ICD-10-CM

## 2014-02-12 LAB — POCT URINE PREGNANCY: PREG TEST UR: POSITIVE

## 2014-02-12 NOTE — Addendum Note (Signed)
Addended by: Odessa FlemingBOHNE, Jennipher Weatherholtz M on: 02/12/2014 05:04 PM   Modules accepted: Level of Service

## 2014-02-12 NOTE — Progress Notes (Signed)
Patient is in the office today for UPT. UPT preformed, Results were positive. LMP: 01/10/14, patient is 4 weeks and 5 days along. EDD: October 18, 2014. Patient states she was using protection but the condom broke. Patient asked if there was anything she could take. Patient given the list of abortion sites. Patient notified that should this ever happen again she should call the office and we could send her Plan B but that she has to call within the first 72 hours after sexual intercourse. Patient notified that this would prevent her from becoming pregnant. Patient notified that if she chose abortion and afterwards if she would like birth control to call the office and we would bring her in for a birth control consult. Patient also notified that if she did choose to keep the pregnancy to call the office and we would get her set up for her New OB appointment. Patient voiced understanding.   BP 128/63  Pulse 79  Temp(Src) 98 F (36.7 C)  Ht 5' 4.5" (1.638 m)  Wt 188 lb (85.276 kg)  BMI 31.78 kg/m2  LMP 01/10/2014  Results for orders placed in visit on 02/12/14 (from the past 24 hour(s))  POCT URINE PREGNANCY     Status: None   Collection Time    02/12/14  5:01 PM      Result Value Ref Range   Preg Test, Ur Positive

## 2014-09-21 ENCOUNTER — Emergency Department (HOSPITAL_COMMUNITY)
Admission: EM | Admit: 2014-09-21 | Discharge: 2014-09-21 | Disposition: A | Discharge status: Home / Self Care | Payer: Self-pay | Source: Home / Self Care

## 2014-09-21 NOTE — ED Notes (Signed)
Room call #3 at 1217.

## 2014-09-21 NOTE — ED Notes (Signed)
Room call #1 at 1157.

## 2014-09-21 NOTE — ED Notes (Signed)
Room call #2 at 1208.

## 2015-01-19 ENCOUNTER — Emergency Department (HOSPITAL_COMMUNITY)
Admission: EM | Admit: 2015-01-19 | Discharge: 2015-01-19 | Disposition: A | Discharge status: Home / Self Care | Payer: Medicaid Other | Attending: Emergency Medicine | Admitting: Emergency Medicine

## 2015-01-19 ENCOUNTER — Encounter (HOSPITAL_COMMUNITY): Payer: Self-pay | Admitting: *Deleted

## 2015-01-19 ENCOUNTER — Emergency Department (HOSPITAL_COMMUNITY): Payer: Medicaid Other

## 2015-01-19 DIAGNOSIS — O9989 Other specified diseases and conditions complicating pregnancy, childbirth and the puerperium: Secondary | ICD-10-CM | POA: Insufficient documentation

## 2015-01-19 DIAGNOSIS — R51 Headache: Secondary | ICD-10-CM | POA: Insufficient documentation

## 2015-01-19 DIAGNOSIS — R1030 Lower abdominal pain, unspecified: Secondary | ICD-10-CM | POA: Insufficient documentation

## 2015-01-19 DIAGNOSIS — O26899 Other specified pregnancy related conditions, unspecified trimester: Secondary | ICD-10-CM

## 2015-01-19 DIAGNOSIS — R109 Unspecified abdominal pain: Secondary | ICD-10-CM

## 2015-01-19 DIAGNOSIS — Z3A01 Less than 8 weeks gestation of pregnancy: Secondary | ICD-10-CM | POA: Insufficient documentation

## 2015-01-19 DIAGNOSIS — J45909 Unspecified asthma, uncomplicated: Secondary | ICD-10-CM | POA: Insufficient documentation

## 2015-01-19 DIAGNOSIS — O99511 Diseases of the respiratory system complicating pregnancy, first trimester: Secondary | ICD-10-CM | POA: Insufficient documentation

## 2015-01-19 LAB — CBC WITH DIFFERENTIAL/PLATELET
BASOS ABS: 0 10*3/uL (ref 0.0–0.1)
BASOS PCT: 1 % (ref 0–1)
EOS ABS: 0 10*3/uL (ref 0.0–0.7)
EOS PCT: 1 % (ref 0–5)
HCT: 41.2 % (ref 36.0–46.0)
HEMOGLOBIN: 14.5 g/dL (ref 12.0–15.0)
LYMPHS ABS: 1.4 10*3/uL (ref 0.7–4.0)
Lymphocytes Relative: 33 % (ref 12–46)
MCH: 31 pg (ref 26.0–34.0)
MCHC: 35.2 g/dL (ref 30.0–36.0)
MCV: 88 fL (ref 78.0–100.0)
Monocytes Absolute: 0.6 10*3/uL (ref 0.1–1.0)
Monocytes Relative: 13 % — ABNORMAL HIGH (ref 3–12)
NEUTROS PCT: 52 % (ref 43–77)
Neutro Abs: 2.3 10*3/uL (ref 1.7–7.7)
PLATELETS: 220 10*3/uL (ref 150–400)
RBC: 4.68 MIL/uL (ref 3.87–5.11)
RDW: 12 % (ref 11.5–15.5)
WBC: 4.3 10*3/uL (ref 4.0–10.5)

## 2015-01-19 LAB — URINALYSIS, ROUTINE W REFLEX MICROSCOPIC
BILIRUBIN URINE: NEGATIVE
Glucose, UA: NEGATIVE mg/dL
HGB URINE DIPSTICK: NEGATIVE
KETONES UR: 15 mg/dL — AB
Leukocytes, UA: NEGATIVE
NITRITE: NEGATIVE
PROTEIN: NEGATIVE mg/dL
SPECIFIC GRAVITY, URINE: 1.026 (ref 1.005–1.030)
UROBILINOGEN UA: 2 mg/dL — AB (ref 0.0–1.0)
pH: 7.5 (ref 5.0–8.0)

## 2015-01-19 LAB — HCG, QUANTITATIVE, PREGNANCY: HCG, BETA CHAIN, QUANT, S: 14100 m[IU]/mL — AB (ref <5)

## 2015-01-19 NOTE — ED Notes (Signed)
Pt reports being approx [redacted] weeks pregnant and having sharp abd pain and is concerned "bc she doesn't feel anything." also having weight loss and headaches. Denies vaginal bleeding or n/v.

## 2015-01-19 NOTE — ED Notes (Signed)
Pt is in stable condition upon d/c and ambulates from ED. 

## 2015-01-19 NOTE — Discharge Instructions (Signed)

## 2015-01-19 NOTE — ED Provider Notes (Signed)
CSN: [id number]     Arrival date & time 01/19/15  8295 History   First MD Initiated Contact with Patient 01/19/15 1209     Chief Complaint  Patient presents with  . Abdominal Pain   HPI  Ms. Goering is a 24 year old female presenting with a home positive pregnancy test and lower abdominal cramping. Pt states she has had one week of intermittent, mild cramping in her lower abdomen. Pt states the pain comes on acutely for a few minutes then resolves on its own. Pain occurs in both left lower abdomen and right lower abdomen. Denies vaginal bleeding, vaginal discharge, pelvic pain, dysuria, nausea or vomiting. Pt also complaining of headaches for the past week that have been controlled with tylenol. Pt not currently experiencing headache. Also reports a 10 lb unintentional weight loss over past few months. Denies fevers, chills, chest pain, SOB, diarrhea, syncope or dizziness.   Past Medical History  Diagnosis Date  . Asthma    Past Surgical History  Procedure Laterality Date  . Wisdom tooth extraction    . Cesarean section  03/09/2012    Procedure: CESAREAN SECTION;  Surgeon: Antionette Char, MD;  Location: [facility];  Service: Obstetrics;  Laterality: N/A;   Family History  Problem Relation Age of Onset  . Other Neg Hx   . Hypertension Father   . Diabetes Father   . Hypertension Maternal Uncle   . Cancer Maternal Grandmother   . Hypertension Maternal Grandfather   . Diabetes Maternal Grandfather    Social History  Substance Use Topics  . Smoking status: Never Smoker   . Smokeless tobacco: Never Used  . Alcohol Use: No   OB History    Gravida Para Term Preterm AB TAB SAB Ectopic Multiple Living   0 0 0 0 0 0 1     Review of Systems  Constitutional: Negative for fever and chills.  Respiratory: Negative for shortness of breath.   Cardiovascular: Negative for chest pain.  Gastrointestinal: Positive for abdominal pain. Negative for nausea, vomiting and diarrhea.   Genitourinary: Negative for dysuria, vaginal bleeding, vaginal discharge and pelvic pain.  Neurological: Positive for headaches. Negative for dizziness, weakness and light-headedness.      Allergies  Review of patient's allergies indicates no known allergies.  Home Medications   Prior to Admission medications   Not on File   BP 108/56 mmHg  Pulse 73  Temp(Src) 98.9 F (37.2 C) (Oral)  Resp 18  Ht  (1.626 m)  Wt 198 lb (89.812 kg)  BMI 33.97 kg/m2  SpO2 100%  LMP 01/10/2014 Physical Exam  Constitutional: She is oriented to person, place, and time. She appears well-developed and well-nourished. No distress.  HENT:  Head: Normocephalic and atraumatic.  Eyes: Conjunctivae and EOM are normal.  Neck: Normal range of motion.  Cardiovascular: Normal rate, regular rhythm and normal heart sounds.   Pulmonary/Chest: Effort normal and breath sounds normal. No respiratory distress. She has no wheezes. She has no rales.  Abdominal: Soft. She exhibits no distension. There is no tenderness. There is no rebound and no guarding.  Musculoskeletal: Normal range of motion.  Moves all extremities spontaneously.  Neurological: She is alert and oriented to person, place, and time.  Skin: Skin is warm and dry.  Psychiatric: She has a normal mood and affect. Her behavior is normal.  Nursing note and vitals reviewed.   ED Course  Procedures (including critical care time) Labs Review Labs Reviewed  HCG, QUANTITATIVE, PREGNANCY - Abnormal; Notable for the following:    hCG, Beta Chain, Quant, S 14100 (*)    All other components within normal limits  URINALYSIS, ROUTINE W REFLEX MICROSCOPIC (NOT AT [facility]) - Abnormal; Notable for the following:    APPearance CLOUDY (*)    Ketones, ur 15 (*)    Urobilinogen, UA 2.0 (*)    All other components within normal limits  CBC WITH DIFFERENTIAL/PLATELET - Abnormal; Notable for the following:    Monocytes Relative 13 (*)    All other components  within normal limits    Imaging Review US Ob Comp Less 14 Wks  01/19/2015   CLINICAL DATA:  Abdominal pain in first trimester pregnancy  EXAM: OBSTETRIC <14 WK [place] AND TRANSVAGINAL OB US  TECHNIQUE: Both transabdominal and transvaginal ultrasound examinations were performed for complete evaluation of the gestation as well as the maternal uterus, adnexal regions, and pelvic cul-de-sac. Transvaginal technique was performed to assess early pregnancy.  COMPARISON:  None.  FINDINGS: Intrauterine gestational sac: Visualized/normal in shape.  Yolk sac:  Present  Embryo:  Present  Cardiac Activity: Present  Heart Rate: 144  bpm  CRL:  4.7  mm   6 w   1 d                  [place] EDC: 09/13/2015  Maternal uterus/adnexae: Corpus luteum noted on the left. The right ovary has an unremarkable appearance. No concerning free pelvic fluid. No subchorionic hematoma.  IMPRESSION: Single living intrauterine gestation measuring 6 weeks 1 day. No pathologic findings.   Electronically Signed   By: Marnee Spring M.D.   On: 01/19/2015 16:26   US Ob Transvaginal  01/19/2015   CLINICAL DATA:  Abdominal pain in first trimester pregnancy  EXAM: OBSTETRIC <14 WK [place] AND TRANSVAGINAL OB US  TECHNIQUE: Both transabdominal and transvaginal ultrasound examinations were performed for complete evaluation of the gestation as well as the maternal uterus, adnexal regions, and pelvic cul-de-sac. Transvaginal technique was performed to assess early pregnancy.  COMPARISON:  None.  FINDINGS: Intrauterine gestational sac: Visualized/normal in shape.  Yolk sac:  Present  Embryo:  Present  Cardiac Activity: Present  Heart Rate: 144  bpm  CRL:  4.7  mm   6 w   1 d                  [place] EDC: 09/13/2015  Maternal uterus/adnexae: Corpus luteum noted on the left. The right ovary has an unremarkable appearance. No concerning free pelvic fluid. No subchorionic hematoma.  IMPRESSION: Single living intrauterine gestation measuring 6 weeks 1 day. No pathologic  findings.   Electronically Signed   By: Marnee Spring M.D.   On: 01/19/2015 16:26   I have personally reviewed and evaluated these images and lab results as part of my medical decision-making.   EKG Interpretation None      MDM   Final diagnoses:  Abdominal pain in pregnancy   Pt presenting with abdominal pain in pregnancy. Intermittent mild cramping for past week without fevers, vaginal bleeding, discharge or dysuria. VSS. Pt nontoxic and resting comfortably. Pain resolved in ED. Abdomen soft and non-tender. HCG quant confirms pregnancy. Transvaginal US showing IUP at 6 weeks. Return precautions given in discharge paperwork and discussed with pt. Stable for discharge.     Alveta Heimlich, PA-C 01/19/15 1640  Bethann Berkshire, MD 01/20/15 [phone]

## 2015-01-19 NOTE — ED Notes (Signed)
Patient transported to Ultrasound 

## 2015-02-16 ENCOUNTER — Emergency Department (HOSPITAL_COMMUNITY)
Admission: EM | Admit: 2015-02-16 | Discharge: 2015-02-16 | Disposition: A | Discharge status: Home / Self Care | Payer: Medicaid Other | Attending: Emergency Medicine | Admitting: Emergency Medicine

## 2015-02-16 ENCOUNTER — Encounter (HOSPITAL_COMMUNITY): Payer: Self-pay | Admitting: Nurse Practitioner

## 2015-02-16 DIAGNOSIS — Z3A11 11 weeks gestation of pregnancy: Secondary | ICD-10-CM | POA: Insufficient documentation

## 2015-02-16 DIAGNOSIS — O99511 Diseases of the respiratory system complicating pregnancy, first trimester: Secondary | ICD-10-CM | POA: Insufficient documentation

## 2015-02-16 DIAGNOSIS — O99351 Diseases of the nervous system complicating pregnancy, first trimester: Secondary | ICD-10-CM | POA: Diagnosis not present

## 2015-02-16 DIAGNOSIS — O9989 Other specified diseases and conditions complicating pregnancy, childbirth and the puerperium: Secondary | ICD-10-CM | POA: Diagnosis present

## 2015-02-16 DIAGNOSIS — G43009 Migraine without aura, not intractable, without status migrainosus: Secondary | ICD-10-CM | POA: Diagnosis not present

## 2015-02-16 DIAGNOSIS — J45909 Unspecified asthma, uncomplicated: Secondary | ICD-10-CM | POA: Insufficient documentation

## 2015-02-16 MED ORDER — DEXAMETHASONE SODIUM PHOSPHATE 10 MG/ML IJ SOLN
10.0000 mg | Freq: Once | INTRAMUSCULAR | Status: AC
  Administered 2015-02-16: 10 mg via INTRAVENOUS
  Filled 2015-02-16: qty 1

## 2015-02-16 MED ORDER — METOCLOPRAMIDE HCL 5 MG/ML IJ SOLN
10.0000 mg | Freq: Once | INTRAMUSCULAR | Status: AC
  Administered 2015-02-16: 10 mg via INTRAVENOUS
  Filled 2015-02-16: qty 2

## 2015-02-16 MED ORDER — SODIUM CHLORIDE 0.9 % IV SOLN
Freq: Once | INTRAVENOUS | Status: AC
  Administered 2015-02-16: 18:00:00 via INTRAVENOUS

## 2015-02-16 MED ORDER — DIPHENHYDRAMINE HCL 50 MG/ML IJ SOLN
25.0000 mg | Freq: Once | INTRAMUSCULAR | Status: AC
  Administered 2015-02-16: 25 mg via INTRAVENOUS
  Filled 2015-02-16: qty 1

## 2015-02-16 NOTE — ED Notes (Signed)
She c/o migraine headache, she tried tylenol with no relief. She has past history migraines, this feels like her typical migraine. She reports n/v. Pain behind her eyes. A&Ox4

## 2015-02-16 NOTE — ED Provider Notes (Signed)
CSN: [id number]     Arrival date & time 02/16/15  1541 History   First MD Initiated Contact with Patient 02/16/15 1700     Chief Complaint  Patient presents with  . Migraine     (Consider location/radiation/quality/duration/timing/severity/associated sxs/prior Treatment) Patient is a 24 y.o. female presenting with headaches. The history is provided by the patient.  Headache Pain location:  R parietal Quality: throbbing. Radiates to:  Eyes Severity currently:  10/10 Onset quality:  Gradual Duration:  5 hours Timing:  Constant Progression:  Worsening Chronicity:  New Similar to prior headaches: yes   Relieved by:  Nothing Worsened by:  Light and sound Ineffective treatments:  Acetaminophen Associated symptoms: blurred vision, nausea, photophobia and vomiting   Associated symptoms: no abdominal pain, no cough, no ear pain, no fever, no sinus pressure and no sore throat    Jean Dougherty is a 24 y.o. G2P1001 @ [redacted] weeks gestation presents to the ED with a headache that is typical of her usual migraine.  Prenatal care with the health department.   Past Medical History  Diagnosis Date  . Asthma    Past Surgical History  Procedure Laterality Date  . Wisdom tooth extraction    . Cesarean section  03/09/2012    Procedure: CESAREAN SECTION;  Surgeon: Antionette Char, MD;  Location: [facility];  Service: Obstetrics;  Laterality: N/A;   Family History  Problem Relation Age of Onset  . Other Neg Hx   . Hypertension Father   . Diabetes Father   . Hypertension Maternal Uncle   . Cancer Maternal Grandmother   . Hypertension Maternal Grandfather   . Diabetes Maternal Grandfather    Social History  Substance Use Topics  . Smoking status: Never Smoker   . Smokeless tobacco: Never Used  . Alcohol Use: No   OB History    Gravida Para Term Preterm AB TAB SAB Ectopic Multiple Living   0 0 0 0 0 0 1     Review of Systems  Constitutional: Negative for fever and chills.   HENT: Negative for dental problem, ear pain, facial swelling, sinus pressure and sore throat.   Eyes: Positive for blurred vision, photophobia and visual disturbance.  Respiratory: Negative for cough, shortness of breath and wheezing.   Cardiovascular: Negative for chest pain, palpitations and leg swelling.  Gastrointestinal: Positive for nausea and vomiting. Negative for abdominal pain.  Genitourinary: Negative for dysuria, urgency, frequency, flank pain, vaginal bleeding and vaginal discharge.       [redacted] weeks gestation  Skin: Negative for rash.  Neurological: Positive for headaches. Negative for syncope, facial asymmetry and light-headedness.  Psychiatric/Behavioral: Negative for confusion. The patient is not nervous/anxious.   All other systems negative    Allergies  Review of patient's allergies indicates no known allergies.  Home Medications   Prior to Admission medications   Not on File   BP 110/60 mmHg  Pulse 75  Temp(Src) 98.2 F (36.8 C) (Oral)  Resp 18  SpO2 100% Physical Exam  Constitutional: She is oriented to person, place, and time. She appears well-developed and well-nourished. No distress.  HENT:  Head: Normocephalic and atraumatic.  Right Ear: Tympanic membrane normal.  Left Ear: Tympanic membrane normal.  Nose: Nose normal.  Mouth/Throat: Uvula is midline, oropharynx is clear and moist and mucous membranes are normal.  Eyes: Conjunctivae and EOM are normal.  Neck: Normal range of motion. Neck supple.  Cardiovascular: Normal rate and regular rhythm.   Pulmonary/Chest:  Effort normal. She has no wheezes. She has no rales.  Abdominal: Soft. Bowel sounds are normal. She exhibits no mass. There is no tenderness.  Musculoskeletal: She exhibits no edema.  Radial and pedal pulses strong, adequate circulation, good touch sensation.  Neurological: She is alert and oriented to person, place, and time. She has normal strength. No cranial nerve deficit or sensory  deficit. She displays a negative Romberg sign. Gait normal.  Reflex Scores:      Bicep reflexes are 2+ on the right side and 2+ on the left side.      Brachioradialis reflexes are 2+ on the right side and 2+ on the left side.      Patellar reflexes are 2+ on the right side and 2+ on the left side.      Achilles reflexes are 2+ on the right side and 2+ on the left side. Rapid alternating movement without difficulty. Stands on one foot without difficulty.  Skin: Skin is warm and dry.  Psychiatric: She has a normal mood and affect. Her behavior is normal.  Nursing note and vitals reviewed.   ED Course  Procedures  IV NS, Reglan 10 mg, Benadryl 25 mg, Decadron 10 mg IV  MDM  24 y.o. female @ [redacted] weeks gestation with headache that resolved with treatment. Stable for d/c without neuro deficits. She will follow up with her OB or return here as needed. Discussed with the patient and all questioned fully answered.   Final diagnoses:  Migraine without aura and without status migrainosus, not intractable       Janne Napoleon, NP 02/16/15 2001  Eber Hong, MD 02/17/15 [phone]

## 2015-02-16 NOTE — Discharge Instructions (Signed)

## 2015-02-28 ENCOUNTER — Other Ambulatory Visit (HOSPITAL_COMMUNITY): Payer: Self-pay | Admitting: Nurse Practitioner

## 2015-02-28 DIAGNOSIS — Z3682 Encounter for antenatal screening for nuchal translucency: Secondary | ICD-10-CM

## 2015-02-28 DIAGNOSIS — Z3A13 13 weeks gestation of pregnancy: Secondary | ICD-10-CM

## 2015-02-28 LAB — OB RESULTS CONSOLE GC/CHLAMYDIA
CHLAMYDIA, DNA PROBE: NEGATIVE
GC PROBE AMP, GENITAL: NEGATIVE

## 2015-02-28 LAB — OB RESULTS CONSOLE ANTIBODY SCREEN: Antibody Screen: NEGATIVE

## 2015-02-28 LAB — OB RESULTS CONSOLE HEPATITIS B SURFACE ANTIGEN: Hepatitis B Surface Ag: NEGATIVE

## 2015-02-28 LAB — OB RESULTS CONSOLE ABO/RH: RH Type: POSITIVE

## 2015-02-28 LAB — OB RESULTS CONSOLE RPR: RPR: NONREACTIVE

## 2015-02-28 LAB — OB RESULTS CONSOLE HIV ANTIBODY (ROUTINE TESTING): HIV: NONREACTIVE

## 2015-02-28 LAB — OB RESULTS CONSOLE RUBELLA ANTIBODY, IGM: Rubella: NON-IMMUNE/NOT IMMUNE

## 2015-03-07 ENCOUNTER — Encounter: Payer: Medicaid Other | Admitting: Obstetrics

## 2015-03-08 ENCOUNTER — Ambulatory Visit (HOSPITAL_COMMUNITY): Admission: RE | Admit: 2015-03-08 | Payer: Medicaid Other | Source: Ambulatory Visit

## 2015-03-08 ENCOUNTER — Other Ambulatory Visit (HOSPITAL_COMMUNITY): Payer: Medicaid Other

## 2015-05-12 NOTE — L&D Delivery Note (Signed)
Delivery Note At 8:57 AM a viable female was delivered via VBAC, Spontaneous (Presentation: Right Occiput Anterior).  APGAR: 7, 9; weight  .   Placenta status: Intact, Spontaneous, fould odor noted with delivery.  Cord: 3 vessels with the following complications: None.  Cord pH: n/a  Anesthesia: Epidural  Episiotomy: None Lacerations: 2nd degree;Perineal Suture Repair: 3.0 vicryl Est. Blood Loss 150(mL):    Mom to postpartum.  Baby to Couplet care / Skin to Skin.  Jean DuskyMarie Lawson 09/15/2015, 9:19 AM

## 2015-08-15 LAB — OB RESULTS CONSOLE GBS: STREP GROUP B AG: NEGATIVE

## 2015-08-27 ENCOUNTER — Inpatient Hospital Stay (HOSPITAL_COMMUNITY)
Admission: AD | Admit: 2015-08-27 | Discharge: 2015-08-27 | Disposition: A | Discharge status: Home / Self Care | Payer: Medicaid Other | Source: Ambulatory Visit | Attending: Family Medicine | Admitting: Family Medicine

## 2015-08-27 ENCOUNTER — Encounter (HOSPITAL_COMMUNITY): Payer: Self-pay | Admitting: Advanced Practice Midwife

## 2015-08-27 DIAGNOSIS — O9989 Other specified diseases and conditions complicating pregnancy, childbirth and the puerperium: Secondary | ICD-10-CM

## 2015-08-27 DIAGNOSIS — O26893 Other specified pregnancy related conditions, third trimester: Secondary | ICD-10-CM | POA: Insufficient documentation

## 2015-08-27 DIAGNOSIS — R109 Unspecified abdominal pain: Secondary | ICD-10-CM | POA: Diagnosis not present

## 2015-08-27 DIAGNOSIS — O26899 Other specified pregnancy related conditions, unspecified trimester: Secondary | ICD-10-CM

## 2015-08-27 DIAGNOSIS — Z3A37 37 weeks gestation of pregnancy: Secondary | ICD-10-CM | POA: Insufficient documentation

## 2015-08-27 NOTE — MAU Note (Signed)
Urine sent to lab 

## 2015-08-27 NOTE — MAU Provider Note (Signed)
  History     CSN: [id number]  Arrival date and time: 08/27/15 1258   None     Chief Complaint  Patient presents with  . Contractions   HPI  Jean Dougherty 25 y.o. G2P1001 @ 3938w4d [facility]; She presents to the MAU because she was having contractions and pelvic pressure. She denies vaginal bleeding, LOF and reports positive fetal movement. The nurse has asked me to see this patient because she is concerned with the pt having lower blood pressures. The pt is not symptomatic, no headache, dizziness.   Past Medical History  Diagnosis Date  . Asthma     Past Surgical History  Procedure Laterality Date  . Wisdom tooth extraction    . Cesarean section  03/09/2012    Procedure: CESAREAN SECTION;  Surgeon: Antionette CharLisa Jackson-Moore, MD;  Location: [facility];  Service: Obstetrics;  Laterality: N/A;    Family History  Problem Relation Age of Onset  . Other Neg Hx   . Hypertension Father   . Diabetes Father   . Hypertension Maternal Uncle   . Cancer Maternal Grandmother   . Hypertension Maternal Grandfather   . Diabetes Maternal Grandfather     Social History  Substance Use Topics  . Smoking status: Never Smoker   . Smokeless tobacco: Never Used  . Alcohol Use: No    Allergies: No Known Allergies  Prescriptions prior to admission  Medication Sig Dispense Refill Last Dose  . Prenatal Vit-Fe Fumarate-FA (PRENATAL MULTIVITAMIN) TABS tablet Take 1 tablet by mouth daily at 12 noon.   08/26/2015 at Unknown time    Review of Systems  Constitutional: Negative for fever.  Gastrointestinal: Positive for abdominal pain.  All other systems reviewed and are negative.  Physical Exam   Blood pressure 102/61, pulse 98, temperature 98.4 F (36.9 C), temperature source Oral, resp. rate 16, unknown if currently breastfeeding.  Physical Exam  Nursing note and vitals reviewed. Constitutional: She is oriented to person, place, and time. She appears well-developed and  well-nourished.  HENT:  Head: Normocephalic and atraumatic.  Cardiovascular: Normal rate.   Respiratory: Effort normal. No respiratory distress.  GI: Soft. There is no tenderness.  Genitourinary: Vagina normal.  Musculoskeletal: Normal range of motion.  Neurological: She is alert and oriented to person, place, and time.  Skin: Skin is warm and dry.  Psychiatric: She has a normal mood and affect. Her behavior is normal. Judgment and thought content normal.    MAU Course  Procedures  MDM With correct blood pressure cuff the patients blood pressure is stable. Fhr Reactive, Category 1, occasional uc's.  Dilation: Closed Exam by:: Illene BolusLori Clemmons, CNM   Assessment and Plan  Abdominal pain in pregnancy  Discharge    Clemmons,Lori Grissett 08/27/2015, 2:03 PM

## 2015-08-27 NOTE — MAU Note (Signed)
Pt states she is feeling pressure and is feeling tightness in her belly.  Pt states she is feeling the baby move.  Pt denies any complications this pregnancy.

## 2015-08-27 NOTE — Discharge Instructions (Signed)
Third Trimester of Pregnancy °The third trimester is from week 29 through week 42, months 7 through 9. The third trimester is a time when the fetus is growing rapidly. At the end of the ninth month, the fetus is about 20 inches in length and weighs 6-10 pounds.  °BODY CHANGES °Your body goes through many changes during pregnancy. The changes vary from woman to woman.  °· Your weight will continue to increase. You can expect to gain 25-35 pounds (11-16 kg) by the end of the pregnancy. °· You may begin to get stretch marks on your hips, abdomen, and breasts. °· You may urinate more often because the fetus is moving lower into your pelvis and pressing on your bladder. °· You may develop or continue to have heartburn as a result of your pregnancy. °· You may develop constipation because certain hormones are causing the muscles that push waste through your intestines to slow down. °· You may develop hemorrhoids or swollen, bulging veins (varicose veins). °· You may have pelvic pain because of the weight gain and pregnancy hormones relaxing your joints between the bones in your pelvis. Backaches may result from overexertion of the muscles supporting your posture. °· You may have changes in your hair. These can include thickening of your hair, rapid growth, and changes in texture. Some women also have hair loss during or after pregnancy, or hair that feels dry or thin. Your hair will most likely return to normal after your baby is born. °· Your breasts will continue to grow and be tender. A yellow discharge may leak from your breasts called colostrum. °· Your belly button may stick out. °· You may feel short of breath because of your expanding uterus. °· You may notice the fetus "dropping," or moving lower in your abdomen. °· You may have a bloody mucus discharge. This usually occurs a few days to a week before labor begins. °· Your cervix becomes thin and soft (effaced) near your due date. °WHAT TO EXPECT AT YOUR PRENATAL  EXAMS  °You will have prenatal exams every 2 weeks until week 36. Then, you will have weekly prenatal exams. During a routine prenatal visit: °· You will be weighed to make sure you and the fetus are growing normally. °· Your blood pressure is taken. °· Your abdomen will be measured to track your baby's growth. °· The fetal heartbeat will be listened to. °· Any test results from the previous visit will be discussed. °· You may have a cervical check near your due date to see if you have effaced. °At around 36 weeks, your caregiver will check your cervix. At the same time, your caregiver will also perform a test on the secretions of the vaginal tissue. This test is to determine if a type of bacteria, Group B streptococcus, is present. Your caregiver will explain this further. °Your caregiver may ask you: °· What your birth plan is. °· How you are feeling. °· If you are feeling the baby move. °· If you have had any abnormal symptoms, such as leaking fluid, bleeding, severe headaches, or abdominal cramping. °· If you are using any tobacco products, including cigarettes, chewing tobacco, and electronic cigarettes. °· If you have any questions. °Other tests or screenings that may be performed during your third trimester include: °· Blood tests that check for low iron levels (anemia). °· Fetal testing to check the health, activity level, and growth of the fetus. Testing is done if you have certain medical conditions or if   there are problems during the pregnancy. °· HIV (human immunodeficiency virus) testing. If you are at high risk, you may be screened for HIV during your third trimester of pregnancy. °FALSE LABOR °You may feel small, irregular contractions that eventually go away. These are called Braxton Hicks contractions, or false labor. Contractions may last for hours, days, or even weeks before true labor sets in. If contractions come at regular intervals, intensify, or become painful, it is best to be seen by your  caregiver.  °SIGNS OF LABOR  °· Menstrual-like cramps. °· Contractions that are 5 minutes apart or less. °· Contractions that start on the top of the uterus and spread down to the lower abdomen and back. °· A sense of increased pelvic pressure or back pain. °· A watery or bloody mucus discharge that comes from the vagina. °If you have any of these signs before the 37th week of pregnancy, call your caregiver right away. You need to go to the hospital to get checked immediately. °HOME CARE INSTRUCTIONS  °· Avoid all smoking, herbs, alcohol, and unprescribed drugs. These chemicals affect the formation and growth of the baby. °· Do not use any tobacco products, including cigarettes, chewing tobacco, and electronic cigarettes. If you need help quitting, ask your health care provider. You may receive counseling support and other resources to help you quit. °· Follow your caregiver's instructions regarding medicine use. There are medicines that are either safe or unsafe to take during pregnancy. °· Exercise only as directed by your caregiver. Experiencing uterine cramps is a good sign to stop exercising. °· Continue to eat regular, healthy meals. °· Wear a good support bra for breast tenderness. °· Do not use hot tubs, steam rooms, or saunas. °· Wear your seat belt at all times when driving. °· Avoid raw meat, uncooked cheese, cat litter boxes, and soil used by cats. These carry germs that can cause birth defects in the baby. °· Take your prenatal vitamins. °· Take 1500-2000 mg of calcium daily starting at the 20th week of pregnancy until you deliver your baby. °· Try taking a stool softener (if your caregiver approves) if you develop constipation. Eat more high-fiber foods, such as fresh vegetables or fruit and whole grains. Drink plenty of fluids to keep your urine clear or pale yellow. °· Take warm sitz baths to soothe any pain or discomfort caused by hemorrhoids. Use hemorrhoid cream if your caregiver approves. °· If  you develop varicose veins, wear support hose. Elevate your feet for 15 minutes, 3-4 times a day. Limit salt in your diet. °· Avoid heavy lifting, wear low heal shoes, and practice good posture. °· Rest a lot with your legs elevated if you have leg cramps or low back pain. °· Visit your dentist if you have not gone during your pregnancy. Use a soft toothbrush to brush your teeth and be gentle when you floss. °· A sexual relationship may be continued unless your caregiver directs you otherwise. °· Do not travel far distances unless it is absolutely necessary and only with the approval of your caregiver. °· Take prenatal classes to understand, practice, and ask questions about the labor and delivery. °· Make a trial run to the hospital. °· Pack your hospital bag. °· Prepare the baby's nursery. °· Continue to go to all your prenatal visits as directed by your caregiver. °SEEK MEDICAL CARE IF: °· You are unsure if you are in labor or if your water has broken. °· You have dizziness. °· You have   mild pelvic cramps, pelvic pressure, or nagging pain in your abdominal area. °· You have persistent nausea, vomiting, or diarrhea. °· You have a bad smelling vaginal discharge. °· You have pain with urination. °SEEK IMMEDIATE MEDICAL CARE IF:  °· You have a fever. °· You are leaking fluid from your vagina. °· You have spotting or bleeding from your vagina. °· You have severe abdominal cramping or pain. °· You have rapid weight loss or gain. °· You have shortness of breath with chest pain. °· You notice sudden or extreme swelling of your face, hands, ankles, feet, or legs. °· You have not felt your baby move in over an hour. °· You have severe headaches that do not go away with medicine. °· You have vision changes. °  °This information is not intended to replace advice given to you by your health care provider. Make sure you discuss any questions you have with your health care provider. °  °Document Released: 04/21/2001 Document  Revised: 05/18/2014 Document Reviewed: 06/28/2012 °[employer] Interactive Patient Education ©2016 [employer] °Fetal Movement Counts °Patient Name: __________________________________________________ Patient Due Date: ____________________ °Performing a fetal movement count is highly recommended in high-risk pregnancies, but it is good for every pregnant woman to do. Your health care provider may ask you to start counting fetal movements at 28 weeks of the pregnancy. Fetal movements often increase: °After eating a full meal. °After physical activity. °After eating or drinking something sweet or cold. °At rest. °Pay attention to when you feel the baby is most active. This will help you notice a pattern of your baby's sleep and wake cycles and what factors contribute to an increase in fetal movement. It is important to perform a fetal movement count at the same time each day when your baby is normally most active.  °HOW TO COUNT FETAL MOVEMENTS °Find a quiet and comfortable area to sit or lie down on your left side. Lying on your left side provides the best blood and oxygen circulation to your baby. °Write down the day and time on a sheet of paper or in a journal. °Start counting kicks, flutters, swishes, rolls, or jabs in a 2-hour period. You should feel at least 10 movements within 2 hours. °If you do not feel 10 movements in 2 hours, wait 2-3 hours and count again. Look for a change in the pattern or not enough counts in 2 hours. °SEEK MEDICAL CARE IF: °You feel less than 10 counts in 2 hours, tried twice. °There is no movement in over an hour. °The pattern is changing or taking longer each day to reach 10 counts in 2 hours. °You feel the baby is not moving as he or she usually does. °Date: ____________ Movements: ____________ Start time: ____________ Finish time: ____________  °Date: ____________ Movements: ____________ Start time: ____________ Finish time: ____________ °Date: ____________ Movements: ____________  Start time: ____________ Finish time: ____________ °Date: ____________ Movements: ____________ Start time: ____________ Finish time: ____________ °Date: ____________ Movements: ____________ Start time: ____________ Finish time: ____________ °Date: ____________ Movements: ____________ Start time: ____________ Finish time: ____________ °Date: ____________ Movements: ____________ Start time: ____________ Finish time: ____________ °Date: ____________ Movements: ____________ Start time: ____________ Finish time: ____________  °Date: ____________ Movements: ____________ Start time: ____________ Finish time: ____________ °Date: ____________ Movements: ____________ Start time: ____________ Finish time: ____________ °Date: ____________ Movements: ____________ Start time: ____________ Finish time: ____________ °Date: ____________ Movements: ____________ Start time: ____________ Finish time: ____________ °Date: ____________ Movements: ____________ Start time: ____________ Finish time: ____________ °Date: ____________ Movements:   ____________ Start time: ____________ Finish time: ____________ °Date: ____________ Movements: ____________ Start time: ____________ Finish time: ____________  °Date: ____________ Movements: ____________ Start time: ____________ Finish time: ____________ °Date: ____________ Movements: ____________ Start time: ____________ Finish time: ____________ °Date: ____________ Movements: ____________ Start time: ____________ Finish time: ____________ °Date: ____________ Movements: ____________ Start time: ____________ Finish time: ____________ °Date: ____________ Movements: ____________ Start time: ____________ Finish time: ____________ °Date: ____________ Movements: ____________ Start time: ____________ Finish time: ____________ °Date: ____________ Movements: ____________ Start time: ____________ Finish time: ____________  °Date: ____________ Movements: ____________ Start time: ____________ Finish time:  ____________ °Date: ____________ Movements: ____________ Start time: ____________ Finish time: ____________ °Date: ____________ Movements: ____________ Start time: ____________ Finish time: ____________ °Date: ____________ Movements: ____________ Start time: ____________ Finish time: ____________ °Date: ____________ Movements: ____________ Start time: ____________ Finish time: ____________ °Date: ____________ Movements: ____________ Start time: ____________ Finish time: ____________ °Date: ____________ Movements: ____________ Start time: ____________ Finish time: ____________  °Date: ____________ Movements: ____________ Start time: ____________ Finish time: ____________ °Date: ____________ Movements: ____________ Start time: ____________ Finish time: ____________ °Date: ____________ Movements: ____________ Start time: ____________ Finish time: ____________ °Date: ____________ Movements: ____________ Start time: ____________ Finish time: ____________ °Date: ____________ Movements: ____________ Start time: ____________ Finish time: ____________ °Date: ____________ Movements: ____________ Start time: ____________ Finish time: ____________ °Date: ____________ Movements: ____________ Start time: ____________ Finish time: ____________  °Date: ____________ Movements: ____________ Start time: ____________ Finish time: ____________ °Date: ____________ Movements: ____________ Start time: ____________ Finish time: ____________ °Date: ____________ Movements: ____________ Start time: ____________ Finish time: ____________ °Date: ____________ Movements: ____________ Start time: ____________ Finish time: ____________ °Date: ____________ Movements: ____________ Start time: ____________ Finish time: ____________ °Date: ____________ Movements: ____________ Start time: ____________ Finish time: ____________ °Date: ____________ Movements: ____________ Start time: ____________ Finish time: ____________  °Date: ____________ Movements:  ____________ Start time: ____________ Finish time: ____________ °Date: ____________ Movements: ____________ Start time: ____________ Finish time: ____________ °Date: ____________ Movements: ____________ Start time: ____________ Finish time: ____________ °Date: ____________ Movements: ____________ Start time: ____________ Finish time: ____________ °Date: ____________ Movements: ____________ Start time: ____________ Finish time: ____________ °Date: ____________ Movements: ____________ Start time: ____________ Finish time: ____________ °Date: ____________ Movements: ____________ Start time: ____________ Finish time: ____________  °Date: ____________ Movements: ____________ Start time: ____________ Finish time: ____________ °Date: ____________ Movements: ____________ Start time: ____________ Finish time: ____________ °Date: ____________ Movements: ____________ Start time: ____________ Finish time: ____________ °Date: ____________ Movements: ____________ Start time: ____________ Finish time: ____________ °Date: ____________ Movements: ____________ Start time: ____________ Finish time: ____________ °Date: ____________ Movements: ____________ Start time: ____________ Finish time: ____________ °  °This information is not intended to replace advice given to you by your health care provider. Make sure you discuss any questions you have with your health care provider. °  °Document Released: 05/27/2006 Document Revised: 05/18/2014 Document Reviewed: 02/22/2012 °[employer] Interactive Patient Education ©2016 [employer] ° °

## 2015-09-11 ENCOUNTER — Inpatient Hospital Stay (HOSPITAL_COMMUNITY)
Admission: AD | Admit: 2015-09-11 | Discharge: 2015-09-11 | Disposition: A | Discharge status: Home / Self Care | Payer: Medicaid Other | Source: Ambulatory Visit | Attending: Obstetrics and Gynecology | Admitting: Obstetrics and Gynecology

## 2015-09-11 ENCOUNTER — Telehealth (HOSPITAL_COMMUNITY): Payer: Self-pay | Admitting: *Deleted

## 2015-09-11 ENCOUNTER — Encounter (HOSPITAL_COMMUNITY): Payer: Self-pay

## 2015-09-11 DIAGNOSIS — Z3A40 40 weeks gestation of pregnancy: Secondary | ICD-10-CM | POA: Diagnosis not present

## 2015-09-11 LAB — URINALYSIS, ROUTINE W REFLEX MICROSCOPIC
Bilirubin Urine: NEGATIVE
Glucose, UA: NEGATIVE mg/dL
Hgb urine dipstick: NEGATIVE
KETONES UR: NEGATIVE mg/dL
LEUKOCYTES UA: NEGATIVE
NITRITE: NEGATIVE
PROTEIN: NEGATIVE mg/dL
Specific Gravity, Urine: 1.015 (ref 1.005–1.030)
pH: 7.5 (ref 5.0–8.0)

## 2015-09-11 NOTE — Telephone Encounter (Signed)
Preadmission screen  

## 2015-09-11 NOTE — Discharge Instructions (Signed)
Fetal Movement Counts  Patient Name: __________________________________________________ Patient Due Date: ____________________  Performing a fetal movement count is highly recommended in high-risk pregnancies, but it is good for every pregnant woman to do. Your health care provider may ask you to start counting fetal movements at 28 weeks of the pregnancy. Fetal movements often increase:  · After eating a full meal.  · After physical activity.  · After eating or drinking something sweet or cold.  · At rest.  Pay attention to when you feel the baby is most active. This will help you notice a pattern of your baby's sleep and wake cycles and what factors contribute to an increase in fetal movement. It is important to perform a fetal movement count at the same time each day when your baby is normally most active.   HOW TO COUNT FETAL MOVEMENTS  1. Find a quiet and comfortable area to sit or lie down on your left side. Lying on your left side provides the best blood and oxygen circulation to your baby.  2. Write down the day and time on a sheet of paper or in a journal.  3. Start counting kicks, flutters, swishes, rolls, or jabs in a 2-hour period. You should feel at least 10 movements within 2 hours.  4. If you do not feel 10 movements in 2 hours, wait 2-3 hours and count again. Look for a change in the pattern or not enough counts in 2 hours.  SEEK MEDICAL CARE IF:  · You feel less than 10 counts in 2 hours, tried twice.  · There is no movement in over an hour.  · The pattern is changing or taking longer each day to reach 10 counts in 2 hours.  · You feel the baby is not moving as he or she usually does.  Date: ____________ Movements: ____________ Start time: ____________ Finish time: ____________   Date: ____________ Movements: ____________ Start time: ____________ Finish time: ____________  Date: ____________ Movements: ____________ Start time: ____________ Finish time: ____________  Date: ____________ Movements:  ____________ Start time: ____________ Finish time: ____________  Date: ____________ Movements: ____________ Start time: ____________ Finish time: ____________  Date: ____________ Movements: ____________ Start time: ____________ Finish time: ____________  Date: ____________ Movements: ____________ Start time: ____________ Finish time: ____________  Date: ____________ Movements: ____________ Start time: ____________ Finish time: ____________   Date: ____________ Movements: ____________ Start time: ____________ Finish time: ____________  Date: ____________ Movements: ____________ Start time: ____________ Finish time: ____________  Date: ____________ Movements: ____________ Start time: ____________ Finish time: ____________  Date: ____________ Movements: ____________ Start time: ____________ Finish time: ____________  Date: ____________ Movements: ____________ Start time: ____________ Finish time: ____________  Date: ____________ Movements: ____________ Start time: ____________ Finish time: ____________  Date: ____________ Movements: ____________ Start time: ____________ Finish time: ____________   Date: ____________ Movements: ____________ Start time: ____________ Finish time: ____________  Date: ____________ Movements: ____________ Start time: ____________ Finish time: ____________  Date: ____________ Movements: ____________ Start time: ____________ Finish time: ____________  Date: ____________ Movements: ____________ Start time: ____________ Finish time: ____________  Date: ____________ Movements: ____________ Start time: ____________ Finish time: ____________  Date: ____________ Movements: ____________ Start time: ____________ Finish time: ____________  Date: ____________ Movements: ____________ Start time: ____________ Finish time: ____________   Date: ____________ Movements: ____________ Start time: ____________ Finish time: ____________  Date: ____________ Movements: ____________ Start time: ____________ Finish  time: ____________  Date: ____________ Movements: ____________ Start time: ____________ Finish time: ____________  Date: ____________ Movements: ____________ Start time:   ____________ Finish time: ____________  Date: ____________ Movements: ____________ Start time: ____________ Finish time: ____________  Date: ____________ Movements: ____________ Start time: ____________ Finish time: ____________  Date: ____________ Movements: ____________ Start time: ____________ Finish time: ____________   Date: ____________ Movements: ____________ Start time: ____________ Finish time: ____________  Date: ____________ Movements: ____________ Start time: ____________ Finish time: ____________  Date: ____________ Movements: ____________ Start time: ____________ Finish time: ____________  Date: ____________ Movements: ____________ Start time: ____________ Finish time: ____________  Date: ____________ Movements: ____________ Start time: ____________ Finish time: ____________  Date: ____________ Movements: ____________ Start time: ____________ Finish time: ____________  Date: ____________ Movements: ____________ Start time: ____________ Finish time: ____________   Date: ____________ Movements: ____________ Start time: ____________ Finish time: ____________  Date: ____________ Movements: ____________ Start time: ____________ Finish time: ____________  Date: ____________ Movements: ____________ Start time: ____________ Finish time: ____________  Date: ____________ Movements: ____________ Start time: ____________ Finish time: ____________  Date: ____________ Movements: ____________ Start time: ____________ Finish time: ____________  Date: ____________ Movements: ____________ Start time: ____________ Finish time: ____________  Date: ____________ Movements: ____________ Start time: ____________ Finish time: ____________   Date: ____________ Movements: ____________ Start time: ____________ Finish time: ____________  Date: ____________  Movements: ____________ Start time: ____________ Finish time: ____________  Date: ____________ Movements: ____________ Start time: ____________ Finish time: ____________  Date: ____________ Movements: ____________ Start time: ____________ Finish time: ____________  Date: ____________ Movements: ____________ Start time: ____________ Finish time: ____________  Date: ____________ Movements: ____________ Start time: ____________ Finish time: ____________  Date: ____________ Movements: ____________ Start time: ____________ Finish time: ____________   Date: ____________ Movements: ____________ Start time: ____________ Finish time: ____________  Date: ____________ Movements: ____________ Start time: ____________ Finish time: ____________  Date: ____________ Movements: ____________ Start time: ____________ Finish time: ____________  Date: ____________ Movements: ____________ Start time: ____________ Finish time: ____________  Date: ____________ Movements: ____________ Start time: ____________ Finish time: ____________  Date: ____________ Movements: ____________ Start time: ____________ Finish time: ____________     This information is not intended to replace advice given to you by your health care provider. Make sure you discuss any questions you have with your health care provider.     Document Released: 05/27/2006 Document Revised: 05/18/2014 Document Reviewed: 02/22/2012  [employer] Interactive Patient Education ©2016 [employer]

## 2015-09-11 NOTE — MAU Note (Signed)
Pt c/o contractions since she woke up this morning every 5 to 7 minutes. Pt denies bleeding and leaking of fluid. Pt states baby is moving less than normal.

## 2015-09-14 ENCOUNTER — Inpatient Hospital Stay (HOSPITAL_COMMUNITY)
Admission: AD | Admit: 2015-09-14 | Discharge: 2015-09-17 | DRG: 775 | Disposition: A | Discharge status: Home / Self Care | Payer: Medicaid Other | Source: Ambulatory Visit | Attending: Obstetrics & Gynecology | Admitting: Obstetrics & Gynecology

## 2015-09-14 ENCOUNTER — Inpatient Hospital Stay (HOSPITAL_COMMUNITY): Payer: Medicaid Other | Admitting: Anesthesiology

## 2015-09-14 ENCOUNTER — Encounter (HOSPITAL_COMMUNITY): Payer: Self-pay | Admitting: *Deleted

## 2015-09-14 DIAGNOSIS — O99214 Obesity complicating childbirth: Secondary | ICD-10-CM | POA: Diagnosis present

## 2015-09-14 DIAGNOSIS — Z2839 Other underimmunization status: Secondary | ICD-10-CM

## 2015-09-14 DIAGNOSIS — F1290 Cannabis use, unspecified, uncomplicated: Secondary | ICD-10-CM | POA: Diagnosis present

## 2015-09-14 DIAGNOSIS — O411230 Chorioamnionitis, third trimester, not applicable or unspecified: Secondary | ICD-10-CM | POA: Diagnosis present

## 2015-09-14 DIAGNOSIS — O0930 Supervision of pregnancy with insufficient antenatal care, unspecified trimester: Secondary | ICD-10-CM

## 2015-09-14 DIAGNOSIS — O34211 Maternal care for low transverse scar from previous cesarean delivery: Secondary | ICD-10-CM | POA: Diagnosis present

## 2015-09-14 DIAGNOSIS — Z79899 Other long term (current) drug therapy: Secondary | ICD-10-CM | POA: Diagnosis not present

## 2015-09-14 DIAGNOSIS — Z98891 History of uterine scar from previous surgery: Secondary | ICD-10-CM | POA: Diagnosis present

## 2015-09-14 DIAGNOSIS — E669 Obesity, unspecified: Secondary | ICD-10-CM | POA: Diagnosis present

## 2015-09-14 DIAGNOSIS — Z833 Family history of diabetes mellitus: Secondary | ICD-10-CM | POA: Diagnosis not present

## 2015-09-14 DIAGNOSIS — O34219 Maternal care for unspecified type scar from previous cesarean delivery: Secondary | ICD-10-CM

## 2015-09-14 DIAGNOSIS — O99810 Abnormal glucose complicating pregnancy: Secondary | ICD-10-CM

## 2015-09-14 DIAGNOSIS — O09899 Supervision of other high risk pregnancies, unspecified trimester: Secondary | ICD-10-CM

## 2015-09-14 DIAGNOSIS — O4212 Full-term premature rupture of membranes, onset of labor more than 24 hours following rupture: Secondary | ICD-10-CM | POA: Diagnosis present

## 2015-09-14 DIAGNOSIS — Z3490 Encounter for supervision of normal pregnancy, unspecified, unspecified trimester: Secondary | ICD-10-CM

## 2015-09-14 DIAGNOSIS — Z3A40 40 weeks gestation of pregnancy: Secondary | ICD-10-CM | POA: Diagnosis not present

## 2015-09-14 DIAGNOSIS — O9989 Other specified diseases and conditions complicating pregnancy, childbirth and the puerperium: Secondary | ICD-10-CM

## 2015-09-14 DIAGNOSIS — Z283 Underimmunization status: Secondary | ICD-10-CM

## 2015-09-14 DIAGNOSIS — Z6841 Body Mass Index (BMI) 40.0 and over, adult: Secondary | ICD-10-CM

## 2015-09-14 DIAGNOSIS — Z8249 Family history of ischemic heart disease and other diseases of the circulatory system: Secondary | ICD-10-CM

## 2015-09-14 DIAGNOSIS — O4292 Full-term premature rupture of membranes, unspecified as to length of time between rupture and onset of labor: Secondary | ICD-10-CM

## 2015-09-14 DIAGNOSIS — O4290 Premature rupture of membranes, unspecified as to length of time between rupture and onset of labor, unspecified weeks of gestation: Secondary | ICD-10-CM | POA: Diagnosis present

## 2015-09-14 DIAGNOSIS — Z809 Family history of malignant neoplasm, unspecified: Secondary | ICD-10-CM

## 2015-09-14 DIAGNOSIS — O99324 Drug use complicating childbirth: Secondary | ICD-10-CM | POA: Diagnosis present

## 2015-09-14 DIAGNOSIS — IMO0001 Reserved for inherently not codable concepts without codable children: Secondary | ICD-10-CM

## 2015-09-14 DIAGNOSIS — O411290 Chorioamnionitis, unspecified trimester, not applicable or unspecified: Secondary | ICD-10-CM

## 2015-09-14 LAB — CBC
HEMATOCRIT: 36.9 % (ref 36.0–46.0)
HEMOGLOBIN: 12.6 g/dL (ref 12.0–15.0)
MCH: 28.6 pg (ref 26.0–34.0)
MCHC: 34.1 g/dL (ref 30.0–36.0)
MCV: 83.9 fL (ref 78.0–100.0)
Platelets: 205 10*3/uL (ref 150–400)
RBC: 4.4 MIL/uL (ref 3.87–5.11)
RDW: 13.8 % (ref 11.5–15.5)
WBC: 4.4 10*3/uL (ref 4.0–10.5)

## 2015-09-14 LAB — TYPE AND SCREEN
ABO/RH(D): A POS
ANTIBODY SCREEN: NEGATIVE

## 2015-09-14 LAB — POCT FERN TEST: POCT FERN TEST: NEGATIVE

## 2015-09-14 LAB — AMNISURE RUPTURE OF MEMBRANE (ROM) NOT AT ARMC: Amnisure ROM: POSITIVE

## 2015-09-14 MED ORDER — LACTATED RINGERS IV SOLN: 500.0000 mL | Freq: Once | INTRAVENOUS | Status: DC

## 2015-09-14 MED ORDER — FENTANYL CITRATE (PF) 100 MCG/2ML IJ SOLN
100.0000 ug | INTRAMUSCULAR | Status: DC | PRN
  Administered 2015-09-14 (×3): 100 ug via INTRAVENOUS
  Filled 2015-09-14 (×3): qty 2

## 2015-09-14 MED ORDER — FENTANYL 2.5 MCG/ML BUPIVACAINE 1/10 % EPIDURAL INFUSION (WH - ANES)
14.0000 mL/h | INTRAMUSCULAR | Status: DC | PRN
  Administered 2015-09-14 – 2015-09-15 (×3): 14 mL/h via EPIDURAL
  Filled 2015-09-14 (×2): qty 125

## 2015-09-14 MED ORDER — OXYCODONE-ACETAMINOPHEN 5-325 MG PO TABS
2.0000 | ORAL_TABLET | ORAL | Status: DC | PRN
  Administered 2015-09-15: 2 via ORAL
  Filled 2015-09-14: qty 2

## 2015-09-14 MED ORDER — PHENYLEPHRINE 40 MCG/ML (10ML) SYRINGE FOR IV PUSH (FOR BLOOD PRESSURE SUPPORT)
80.0000 ug | PREFILLED_SYRINGE | INTRAVENOUS | Status: DC | PRN
  Administered 2015-09-14: 80 ug via INTRAVENOUS

## 2015-09-14 MED ORDER — CITRIC ACID-SODIUM CITRATE 334-500 MG/5ML PO SOLN
30.0000 mL | ORAL | Status: DC | PRN
  Administered 2015-09-14 – 2015-09-15 (×2): 30 mL via ORAL
  Filled 2015-09-14 (×3): qty 15

## 2015-09-14 MED ORDER — LACTATED RINGERS IV SOLN: 500.0000 mL | INTRAVENOUS | Status: DC | PRN

## 2015-09-14 MED ORDER — OXYCODONE-ACETAMINOPHEN 5-325 MG PO TABS: 1.0000 | ORAL_TABLET | ORAL | Status: DC | PRN

## 2015-09-14 MED ORDER — PHENYLEPHRINE 40 MCG/ML (10ML) SYRINGE FOR IV PUSH (FOR BLOOD PRESSURE SUPPORT): 80.0000 ug | PREFILLED_SYRINGE | INTRAVENOUS | Status: DC | PRN

## 2015-09-14 MED ORDER — TERBUTALINE SULFATE 1 MG/ML IJ SOLN: 0.2500 mg | Freq: Once | INTRAMUSCULAR | Status: DC | PRN

## 2015-09-14 MED ORDER — EPHEDRINE 5 MG/ML INJ: 10.0000 mg | INTRAVENOUS | Status: DC | PRN

## 2015-09-14 MED ORDER — LACTATED RINGERS IV SOLN: INTRAVENOUS | Status: DC

## 2015-09-14 MED ORDER — OXYTOCIN BOLUS FROM INFUSION: 500.0000 mL | INTRAVENOUS | Status: DC

## 2015-09-14 MED ORDER — LACTATED RINGERS IV SOLN: 2.5000 [IU]/h | INTRAVENOUS | Status: DC

## 2015-09-14 MED ORDER — OXYCODONE-ACETAMINOPHEN 5-325 MG PO TABS
1.0000 | ORAL_TABLET | ORAL | Status: DC | PRN
  Administered 2015-09-16: 1 via ORAL
  Filled 2015-09-14: qty 1

## 2015-09-14 MED ORDER — LIDOCAINE HCL (PF) 1 % IJ SOLN: 30.0000 mL | INTRAMUSCULAR | Status: DC | PRN

## 2015-09-14 MED ORDER — LIDOCAINE HCL (PF) 1 % IJ SOLN
30.0000 mL | INTRAMUSCULAR | Status: DC | PRN
  Filled 2015-09-14: qty 30

## 2015-09-14 MED ORDER — ONDANSETRON HCL 4 MG/2ML IJ SOLN: 4.0000 mg | Freq: Four times a day (QID) | INTRAMUSCULAR | Status: DC | PRN

## 2015-09-14 MED ORDER — OXYCODONE-ACETAMINOPHEN 5-325 MG PO TABS: 2.0000 | ORAL_TABLET | ORAL | Status: DC | PRN

## 2015-09-14 MED ORDER — LIDOCAINE HCL (PF) 1 % IJ SOLN
INTRAMUSCULAR | Status: DC | PRN
  Administered 2015-09-14: 2 mL via EPIDURAL
  Administered 2015-09-14: 3 mL via EPIDURAL
  Administered 2015-09-14: 5 mL via EPIDURAL

## 2015-09-14 MED ORDER — LACTATED RINGERS IV SOLN
500.0000 mL | Freq: Once | INTRAVENOUS | Status: AC
  Administered 2015-09-14: 500 mL via INTRAVENOUS

## 2015-09-14 MED ORDER — LACTATED RINGERS IV SOLN
500.0000 mL | INTRAVENOUS | Status: DC | PRN
  Administered 2015-09-15: 500 mL via INTRAVENOUS

## 2015-09-14 MED ORDER — ACETAMINOPHEN 325 MG PO TABS
650.0000 mg | ORAL_TABLET | ORAL | Status: DC | PRN
  Administered 2015-09-15: 650 mg via ORAL
  Filled 2015-09-14 (×2): qty 2

## 2015-09-14 MED ORDER — LACTATED RINGERS IV SOLN
INTRAVENOUS | Status: DC
  Administered 2015-09-14 – 2015-09-15 (×4): via INTRAVENOUS

## 2015-09-14 MED ORDER — FENTANYL 2.5 MCG/ML BUPIVACAINE 1/10 % EPIDURAL INFUSION (WH - ANES): 14.0000 mL/h | INTRAMUSCULAR | Status: DC | PRN

## 2015-09-14 MED ORDER — DIPHENHYDRAMINE HCL 50 MG/ML IJ SOLN: 12.5000 mg | INTRAMUSCULAR | Status: DC | PRN

## 2015-09-14 MED ORDER — PHENYLEPHRINE 40 MCG/ML (10ML) SYRINGE FOR IV PUSH (FOR BLOOD PRESSURE SUPPORT)
80.0000 ug | PREFILLED_SYRINGE | INTRAVENOUS | Status: DC | PRN
  Filled 2015-09-14: qty 10

## 2015-09-14 MED ORDER — OXYTOCIN 10 UNIT/ML IJ SOLN: 1.0000 m[IU]/min | INTRAMUSCULAR | Status: DC

## 2015-09-14 MED ORDER — FLEET ENEMA 7-19 GM/118ML RE ENEM: 1.0000 | ENEMA | RECTAL | Status: DC | PRN

## 2015-09-14 MED ORDER — ACETAMINOPHEN 325 MG PO TABS
650.0000 mg | ORAL_TABLET | ORAL | Status: DC | PRN
Start: 2015-09-14 — End: 2015-09-14

## 2015-09-14 MED ORDER — OXYTOCIN 10 UNIT/ML IJ SOLN: 2.5000 [IU]/h | INTRAMUSCULAR | Status: DC

## 2015-09-14 MED ORDER — CITRIC ACID-SODIUM CITRATE 334-500 MG/5ML PO SOLN: 30.0000 mL | ORAL | Status: DC | PRN

## 2015-09-14 NOTE — H&P (Signed)
OBSTETRIC ADMISSION HISTORY AND PHYSICAL  Jean Dougherty is a 25 y.o. female G2P1001 with IUP at 27w1dby 663w1dSKorearesenting for TOLAC after SROM at 7 this morning. She describes the fluid as clear and endorses some vaginal bleeding following rupture. She reports +FMs, no blurry vision, headaches or peripheral edema, or RUQ pain. She denies vomiting, chills, fevers, and SOB. She plans on breast feeding. She request OCPs for birth control.  MAU course: +fern test, + amnisure, baby having repeat variable decels so amnioinfusion performed. IUPC and FSE placed.   Dating: By 6w69w1d [place]->  Estimated Date of Delivery: 09/13/15  Prenatal History/Complications: previous C/s in 2013 due to failure to progress, [facility], next two screens negative (2/17 and 4/17)  Previous C/S in 2013 (J-M), got to 4-5cm, did not progress.   ++ molding,   41 weeks.   Weight 7+6   Low Transverse Incision  Past Medical History: Past Medical History  Diagnosis Date  . Asthma     Past Surgical History: Past Surgical History  Procedure Laterality Date  . Wisdom tooth extraction    . Cesarean section  03/09/2012    Procedure: CESAREAN SECTION;  Surgeon: LisLahoma CrockerD;  [facility];  Service: Obstetrics;  Laterality: N/A;    Obstetrical History: OB History    Gravida Para Term Preterm AB TAB SAB Ectopic Multiple Living   _0 0 0 0 0 0 0 1      Social History: Social History   Social History  . Marital Status: Single    Spouse Name: N/A  . Number of Children: N/A  . Years of Education: N/A   Social History Main Topics  . Smoking status: Never Smoker   . Smokeless tobacco: Never Used  . Alcohol Use: No  . Drug Use: No  . Sexual Activity: Yes    Birth Control/ Protection: None   Other Topics Concern  . None   Social History Narrative    Family History: Family History  Problem Relation Age of Onset  . Other Neg Hx   . Hypertension Father   . Diabetes Father   .  Hypertension Maternal Uncle   . Cancer Maternal Grandmother   . Hypertension Maternal Grandfather   . Diabetes Maternal Grandfather     Allergies: No Known Allergies  Prescriptions prior to admission  Medication Sig Dispense Refill Last Dose  . Prenatal Vit-Fe Fumarate-FA (PRENATAL MULTIVITAMIN) TABS tablet Take 1 tablet by mouth daily at 12 noon.   09/13/2015 at Unknown time     Review of Systems   All systems reviewed and negative except as stated in HPI  Blood pressure 119/70, pulse 90, temperature 98 F (36.7 C), temperature source Oral, resp. rate 20, unknown if currently breastfeeding. General appearance: alert, cooperative and no distress Lungs: clear to auscultation bilaterally Heart: regular rate and rhythm Abdomen: gravid, soft, non-tender; bowel sounds normal Extremities: Homans sign is negative, no sign of DVT* Presentation: cephalic Fetal monitoringBaseline: 135 bpm, Variability: Good {> 6 bpm), Accelerations: Reactive and Decelerations: Absent Uterine activity: q2-5 min, moderate Dilation: Fingertip Effacement (%): 80 Station: -2 Exam by:: MarHansel FeinsteinNM   Prenatal labs: ABO, Rh: --/--/A POS (05/06 0900) Antibody: NEG (05/06 0900) Rubella: Nonimmune RPR: Nonreactive (10/20 0000)  HBsAg: Negative (10/20 0000)  HIV: Non-reactive (10/20 0000)  GBS: Negative (04/06 0000)  1 hr Glucola 137  Genetic screening  Negative Anatomy US [place]1day, normal female  Prenatal Transfer Tool  Maternal Diabetes: No Genetic Screening: Normal Maternal Ultrasounds/Referrals: Normal Fetal Ultrasounds or other Referrals:  None Maternal Substance Abuse:  Yes:  Type: Marijuana Significant Maternal Medications:  None Significant Maternal Lab Results: Lab values include: Group B Strep negative, Other: Rubella nonimmune, +fern test, +Amnisure  Results for orders placed or performed during the hospital encounter of 09/14/15 (from the past 24 hour(s))  Amnisure rupture of  membrane (rom)not at [facility]   Collection Time: 09/14/15  8:11 AM  Result Value Ref Range   Amnisure ROM POSITIVE   Fern Test   Collection Time: 09/14/15  8:18 AM  Result Value Ref Range   POCT Fern Test Negative = intact amniotic membranes   CBC   Collection Time: 09/14/15  9:00 AM  Result Value Ref Range   WBC 4.4 4.0 - 10.5 K/uL   RBC 4.40 3.87 - 5.11 MIL/uL   Hemoglobin 12.6 12.0 - 15.0 g/dL   HCT 36.9 36.0 - 46.0 %   MCV 83.9 78.0 - 100.0 fL   MCH 28.6 26.0 - 34.0 pg   MCHC 34.1 30.0 - 36.0 g/dL   RDW 13.8 11.5 - 15.5 %   Platelets 205 150 - 400 K/uL  Type and screen [place]   Collection Time: 09/14/15  9:00 AM  Result Value Ref Range   ABO/RH(D) A POS    Antibody Screen NEG    Sample Expiration 09/17/2015     Patient Active Problem List   Diagnosis Date Noted  . Marijuana use 09/14/2015  . Rubella non-immune status, antepartum 09/14/2015  . Obesity 09/14/2015  . Previous cesarean delivery, antepartum condition or complication 58/83/2549  . Active labor 09/14/2015  . PROM (premature rupture of membranes) 09/14/2015    Assessment: Jean Dougherty is a 25 y.o. G2P1001 at 63w1dhere for TOLAC after SROM. Doing well.  #Labor: SROM @ 7 am, clear with bloody show. SVE: fingertip/80/-2, s/p amnioinfusion for repeat variables. Pt has consented for TOLAC and understands the risks. Pt has IUPC. Will consider foley bulb and pitocin for augmentation of labor if needed. #Pain: Currently comfortable without medication, considering epidural but unsure #FWB: Variable decels in MAU, currently Cat I (135/mod/a+/d-), continue to monitor closely with FSE #ID:  GBS-/ Rubella nonimmune, will need MMR postpartum #MOF: breast #MOC:OCPs #Circ:  outpatient  CPage Spiro Med Student  09/14/2015, 11:18 AM  Seen and examined by me Agree with note, but please see my note for official documentation  MSeabron Spates CNM

## 2015-09-14 NOTE — Progress Notes (Signed)
Dr. Felipa FurnaceGarcia [facility]  Provider states she will inform Jean Dougherty, CNM and RN can put in admission orders.

## 2015-09-14 NOTE — Progress Notes (Addendum)
Patient ID: Jean NicolasSasha T Boettger, female   DOB: 12/03/1990, 25 y.o.   MRN: [id number] Hurting more now.   Filed Vitals:   09/14/15 1217 09/14/15 1515 09/14/15 1524 09/14/15 1745  BP: 128/56 132/67  123/71  Pulse: 84 81  80  Temp: 97.8 F (36.6 C) 98.1 F (36.7 C)    TempSrc: Oral Oral    Resp:  18  18  Height:   5\' 4"  (1.626 m)   Weight:   255 lb (115.667 kg)    FHR reassuring UCs every 2 min, 150 MVU/3610min  Dilation: Fingertip Effacement (%): 80 Station: -2 Presentation: Vertex Exam by:: Wynelle BourgeoisMarie Kimo Bancroft, CNM  Foley inserted

## 2015-09-14 NOTE — MAU Note (Signed)
Bedside ultrasound done by Wynelle BourgeoisMarie Williams, CNM.  Vertex presentation confirmed.

## 2015-09-14 NOTE — Progress Notes (Signed)
Wynelle BourgeoisMarie Williams, CNM notified of repeat variables.  Provider states to continue to monitor pt and that she will talk to labor and delivery about how soon she will get a bed.

## 2015-09-14 NOTE — Progress Notes (Signed)
Kandra NicolasSasha T Waln is a 25 y.o. G2P1001 at 5530w1d admitted for rupture of membranes  Subjective: Pt comfortable with epidural. Family in room for support.  Objective: BP 119/54 mmHg  Pulse 84  Temp(Src) 97.8 F (36.6 C) (Oral)  Resp 18  Ht 5\' 4"  (1.626 m)  Wt 115.667 kg (255 lb)  BMI 43.75 kg/m2      FHT:  FHR: 135 bpm, variability: moderate,  accelerations:  Present,  decelerations:  Absent UC:   regular, every 2-3 minutes, MVUs 160  SVE:   5/70/-2, vertex  Foley bulb found to be through cervix and in vagina so removed easily by CNM.    Labs: Lab Results  Component Value Date   WBC 4.4 09/14/2015   HGB 12.6 09/14/2015   HCT 36.9 09/14/2015   MCV 83.9 09/14/2015   PLT 205 09/14/2015    Assessment / Plan: Augmentation of labor, progressing well  Labor: Progressing normally Preeclampsia:  n/a Fetal Wellbeing:  Category I Pain Control:  Epidural I/D:  n/a Anticipated MOD:  VBAC  LEFTWICH-KIRBY, Aloma Boch 09/14/2015, 10:03 PM

## 2015-09-14 NOTE — Anesthesia Preprocedure Evaluation (Signed)
Anesthesia Evaluation  Patient identified by MRN, date of birth, ID band Patient awake    Reviewed: Allergy & Precautions, NPO status , Patient's Chart, lab work & pertinent test results  Airway Mallampati: III  TM Distance: >3 FB Neck ROM: Full    Dental  (+) Teeth Intact, Dental Advisory Given   Pulmonary asthma ,    Pulmonary exam normal breath sounds clear to auscultation       Cardiovascular negative cardio ROS Normal cardiovascular exam Rhythm:Regular Rate:Normal     Neuro/Psych negative neurological ROS     GI/Hepatic negative GI ROS, Neg liver ROS,   Endo/Other  Morbid obesity  Renal/GU negative Renal ROS     Musculoskeletal negative musculoskeletal ROS (+)   Abdominal (+) + obese,   Peds  Hematology negative hematology ROS (+) Plt 205k    Anesthesia Other Findings Day of surgery medications reviewed with the patient.  Reproductive/Obstetrics (+) Pregnancy H/o C-section x1 for failure to progress                             Anesthesia Physical Anesthesia Plan  ASA: III  Anesthesia Plan: Epidural   Post-op Pain Management:    Induction:   Airway Management Planned:   Additional Equipment:   Intra-op Plan:   Post-operative Plan:   Informed Consent: I have reviewed the patients History and Physical, chart, labs and discussed the procedure including the risks, benefits and alternatives for the proposed anesthesia with the patient or authorized representative who has indicated his/her understanding and acceptance.   Dental advisory given  Plan Discussed with:   Anesthesia Plan Comments: (Patient identified. Risks/Benefits/Options discussed with patient including but not limited to bleeding, infection, nerve damage, paralysis, failed block, incomplete pain control, headache, blood pressure changes, nausea, vomiting, reactions to medication both or allergic, itching and  postpartum back pain. Confirmed with bedside nurse the patient's most recent platelet count. Confirmed with patient that they are not currently taking any anticoagulation, have any bleeding history or any family history of bleeding disorders. Patient expressed understanding and wished to proceed. All questions were answered. )        Anesthesia Quick Evaluation

## 2015-09-14 NOTE — Anesthesia Procedure Notes (Signed)
Epidural Patient location during procedure: OB  Staffing Anesthesiologist: Cecile HearingURK, Araseli Sherry EDWARD Performed by: anesthesiologist   Preanesthetic Checklist Completed: patient identified, pre-op evaluation, timeout performed, IV checked, risks and benefits discussed and monitors and equipment checked  Epidural Patient position: sitting Prep: DuraPrep Patient monitoring: blood pressure and continuous pulse ox Approach: midline Location: L3-L4 Injection technique: LOR air  Needle:  Needle type: Tuohy  Needle gauge: 17 G Needle length: 9 cm Needle insertion depth: 7 cm Catheter size: 19 Gauge Catheter at skin depth: 12 (withdrawn to 12cm at skin, then taped at 13cm at the skin once no pressure on back) cm Test dose: negative and Other (1% Lidocaine)  Additional Notes Patient identified.  Risk benefits discussed including failed block, incomplete pain control, headache, nerve damage, paralysis, blood pressure changes, nausea, vomiting, reactions to medication both toxic or allergic, and postpartum back pain.  Patient expressed understanding and wished to proceed.  All questions were answered.  Sterile technique used throughout procedure and epidural site dressed with sterile barrier dressing. No paresthesia or other complications noted. The patient did not experience any signs of intravascular injection such as tinnitus or metallic taste in mouth nor signs of intrathecal spread such as rapid motor block. Please see nursing notes for vital signs. Reason for block:procedure for pain

## 2015-09-14 NOTE — MAU Note (Signed)
Pt states she thinks her water broke around 0700.  Pt states it was a large gush of fluid that soaked through her pants.  Pt states she sees some blood when she wipes.  Pt states she feels some pressure but no contractions.  Pt states she is not feeling the baby move as much.

## 2015-09-14 NOTE — Anesthesia Pain Management Evaluation Note (Signed)
  CRNA Pain Management Visit Note  Patient: Jean Dougherty, 25 y.o., female  "Hello I am a [facility] the [facility]. We have an anesthesia team available at all times to provide care throughout the hospital, including epidural management and anesthesia for C-section. I don't know your plan for the delivery whether it a natural birth, water birth, IV sedation, nitrous supplementation, doula or epidural, but we want to meet your pain goals."   1.Was your pain managed to your expectations on prior hospitalizations?   yes  2.What is your expectation for pain management during this hospitalization?     epidural  3.How can we help you reach that goal? Instructed to communicate with RN for RN pain intervention until OB will write order for epidural.  Record the patient's initial score and the patient's pain goal.   Pain:8  Pain Goal:8  [facility] wants you to be able to say your pain was always managed very well.  [facility]ettWRINKLE,Eliab Closson 09/14/2015

## 2015-09-14 NOTE — H&P (Signed)
Jean Dougherty is a 25 y.o. female presenting for leaking fluid. Jean Dougherty is a G2P1001 with IUP at 41w1dby 662w1dSKorearesenting for TOLAC after SROM at 7 this morning. She describes the fluid as clear and endorses some vaginal bleeding following rupture. She reports +FMs, no blurry vision, headaches or peripheral edema, or RUQ pain. She denies vomiting, chills, fevers, and SOB. She plans on breast feeding. She request OCPs for birth control.  MAU course: +fern test, + amnisure, baby having repeat variable decels so amnioinfusion performed. IUPC and FSE placed.   Dating: By 6w66w1d [place]->  Estimated Date of Delivery: 09/13/15  Prenatal History/Complications: previous C/s in 2013 due to failure to progress[facility], next two screens negative (2/17 and 4/17)  RN Note: Pt states she thinks her water broke around 0700. Pt states it was a large gush of fluid that soaked through her pants. Pt states she sees some blood when she wipes. Pt states she feels some pressure but no contractions. Pt states she is not feeling the baby move as much.  Maternal Medical History:  Reason for admission: Rupture of membranes.  Nausea.  Contractions: Onset was 1-2 hours ago.   Frequency: irregular.   Perceived severity is mild.    Fetal activity: Perceived fetal activity is normal.   Last perceived fetal movement was within the past hour.    Prenatal complications: No bleeding, HIV, PIH, infection, placental abnormality or preterm labor.   Prenatal Complications - Diabetes: none.    OB History    Gravida Para Term Preterm AB TAB SAB Ectopic Multiple Living   _0 0 0 0 0 0 0 1     Past Medical History  Diagnosis Date  . Asthma    Past Surgical History  Procedure Laterality Date  . Wisdom tooth extraction    . Cesarean section  03/09/2012    Procedure: CESAREAN SECTION;  Surgeon: LisLahoma CrockerD;  [facility] [place];  Service: Obstetrics;  Laterality: N/A;   Family  History: family history includes Cancer in her maternal grandmother; Diabetes in her father and maternal grandfather; Hypertension in her father, maternal grandfather, and maternal uncle. There is no history of Other. Social History:  reports that she has never smoked. She has never used smokeless tobacco. She reports that she does not drink alcohol or use illicit drugs.   Prenatal Transfer Tool  Maternal Diabetes: No Genetic Screening: Declined Maternal Ultrasounds/Referrals: Normal Fetal Ultrasounds or other Referrals:  None Maternal Substance Abuse:  Yes:  Type: Marijuana Significant Maternal Medications:  None Significant Maternal Lab Results:  Lab values include: Group B Strep negative Other Comments:  Consents to TOLAC, previous C/S for FTP  Review of Systems  Constitutional: Negative for fever, chills and malaise/fatigue.  Respiratory: Negative for shortness of breath.   Cardiovascular: Negative for leg swelling.  Gastrointestinal: Positive for abdominal pain. Negative for nausea, vomiting, diarrhea and constipation.  Genitourinary: Negative for dysuria.  Musculoskeletal: Negative for back pain.    Dilation: Fingertip Effacement (%): 80 Station: -2 Exam by:: MarHansel FeinsteinNM Blood pressure 117/66, pulse 99, temperature 97.9 F (36.6 C), temperature source Oral, resp. rate 16, unknown if currently breastfeeding. Maternal Exam:  Uterine Assessment: Contraction strength is mild.  Contraction frequency is irregular.   Abdomen: Patient reports no abdominal tenderness. Surgical scars: low transverse.   Fundal height is 39.   Estimated fetal weight is 7.   Fetal presentation: vertex  Introitus: Normal  vulva. Vagina is positive for vaginal discharge.  Ferning test: positive.  Nitrazine test: not done. Amniotic fluid character: clear.  Pelvis: adequate for delivery.   Cervix: Cervix evaluated by digital exam.     Fetal Exam Fetal Monitor Review: Mode: ultrasound.    Baseline rate: 140.  Variability: moderate (6-25 bpm).   Pattern: accelerations present and no decelerations.    Fetal State Assessment: Category I - tracings are normal.     Physical Exam  Constitutional: She is oriented to person, place, and time. She appears well-developed and well-nourished. No distress.  Cardiovascular: Normal rate and regular rhythm.  Exam reveals no gallop and no friction rub.   No murmur heard. Respiratory: Effort normal and breath sounds normal. No respiratory distress. She has no wheezes. She has no rales.  GI: Soft. She exhibits no distension. There is no tenderness. There is no rebound and no guarding.  Genitourinary: Vaginal discharge found.  Dilation: Fingertip Effacement (%): 80 Station: -2 Presentation: Vertex Exam by:: Hansel Feinstein, CNM   Musculoskeletal: Normal range of motion.  Neurological: She is alert and oriented to person, place, and time.  Skin: Skin is warm and dry.  Psychiatric: She has a normal mood and affect.    Prenatal labs: ABO, Rh: A/Positive/-- (10/20 0000) Antibody: Negative (10/20 0000) Rubella: Nonimmune (10/20 0000) RPR: Nonreactive (10/20 0000)  HBsAg: Negative (10/20 0000)  HIV: Non-reactive (10/20 0000)  GBS:     Assessment/Plan: #Labor: SROM @ 7 am, clear with bloody show. SVE: fingertip/80/-2, s/p amnioinfusion for repeat variables. Pt has consented                 for TOLAC and understands the risks. Pt has IUPC.               Starting to contract regularly ,  So will watch for labor increase, and if necessary (no progress) will consider foley bulb               and pitocin for augmentation of labor if needed. #Pain: Currently comfortable without medication, considering epidural but unsure #FWB: Variable decels in MAU, currently Cat I (135/mod/a+/d-), continue to monitor closely with FSE              IUPC inserted and Amnioinfusion started              ISE did not trace well, removed             Unable to get  Foley in at this time #ID:  GBS-/ Rubella nonimmune, will need MMR postpartum #MOF: breast #MOC:OCPs #Circ:  outpatient   [facility] 09/14/2015, 9:31 AM

## 2015-09-15 ENCOUNTER — Encounter (HOSPITAL_COMMUNITY): Payer: Self-pay | Admitting: *Deleted

## 2015-09-15 DIAGNOSIS — O99810 Abnormal glucose complicating pregnancy: Secondary | ICD-10-CM

## 2015-09-15 DIAGNOSIS — O0930 Supervision of pregnancy with insufficient antenatal care, unspecified trimester: Secondary | ICD-10-CM

## 2015-09-15 DIAGNOSIS — O411230 Chorioamnionitis, third trimester, not applicable or unspecified: Secondary | ICD-10-CM

## 2015-09-15 DIAGNOSIS — F1290 Cannabis use, unspecified, uncomplicated: Secondary | ICD-10-CM

## 2015-09-15 DIAGNOSIS — O4212 Full-term premature rupture of membranes, onset of labor more than 24 hours following rupture: Secondary | ICD-10-CM

## 2015-09-15 DIAGNOSIS — O99324 Drug use complicating childbirth: Secondary | ICD-10-CM

## 2015-09-15 DIAGNOSIS — O99214 Obesity complicating childbirth: Secondary | ICD-10-CM

## 2015-09-15 DIAGNOSIS — Z3A40 40 weeks gestation of pregnancy: Secondary | ICD-10-CM

## 2015-09-15 HISTORY — DX: Abnormal glucose complicating pregnancy: O99.810

## 2015-09-15 LAB — RPR: RPR Ser Ql: NONREACTIVE

## 2015-09-15 LAB — GLUCOSE, CAPILLARY: Glucose-Capillary: 78 mg/dL (ref 65–99)

## 2015-09-15 MED ORDER — TETANUS-DIPHTH-ACELL PERTUSSIS 5-2.5-18.5 LF-MCG/0.5 IM SUSP: 0.5000 mL | Freq: Once | INTRAMUSCULAR | Status: DC

## 2015-09-15 MED ORDER — SENNOSIDES-DOCUSATE SODIUM 8.6-50 MG PO TABS
2.0000 | ORAL_TABLET | ORAL | Status: DC
Start: 2015-09-16 — End: 2015-09-17
  Administered 2015-09-15 – 2015-09-17 (×2): 2 via ORAL
  Filled 2015-09-15 (×2): qty 2

## 2015-09-15 MED ORDER — ACETAMINOPHEN 500 MG PO TABS: 1000.0000 mg | ORAL_TABLET | Freq: Four times a day (QID) | ORAL | Status: DC | PRN

## 2015-09-15 MED ORDER — WITCH HAZEL-GLYCERIN EX PADS
1.0000 "application " | MEDICATED_PAD | CUTANEOUS | Status: DC | PRN
  Administered 2015-09-16: 1 via TOPICAL

## 2015-09-15 MED ORDER — GENTAMICIN SULFATE 40 MG/ML IJ SOLN
160.0000 mg | Freq: Three times a day (TID) | INTRAVENOUS | Status: AC
  Administered 2015-09-15 – 2015-09-16 (×4): 160 mg via INTRAVENOUS
  Filled 2015-09-15 (×4): qty 4

## 2015-09-15 MED ORDER — PRENATAL MULTIVITAMIN CH
1.0000 | ORAL_TABLET | Freq: Every day | ORAL | Status: DC
  Administered 2015-09-15 – 2015-09-17 (×3): 1 via ORAL
  Filled 2015-09-15 (×3): qty 1

## 2015-09-15 MED ORDER — TERBUTALINE SULFATE 1 MG/ML IJ SOLN: 0.2500 mg | Freq: Once | INTRAMUSCULAR | Status: DC | PRN

## 2015-09-15 MED ORDER — SODIUM CHLORIDE 0.9 % IV SOLN: 250.0000 mL | INTRAVENOUS | Status: DC | PRN

## 2015-09-15 MED ORDER — ZOLPIDEM TARTRATE 5 MG PO TABS: 5.0000 mg | ORAL_TABLET | Freq: Every evening | ORAL | Status: DC | PRN

## 2015-09-15 MED ORDER — IBUPROFEN 600 MG PO TABS
600.0000 mg | ORAL_TABLET | Freq: Four times a day (QID) | ORAL | Status: DC
  Administered 2015-09-15 – 2015-09-17 (×9): 600 mg via ORAL
  Filled 2015-09-15 (×8): qty 1

## 2015-09-15 MED ORDER — COCONUT OIL OIL
1.0000 "application " | TOPICAL_OIL | Status: DC | PRN
  Administered 2015-09-16: 1 via TOPICAL
  Filled 2015-09-15: qty 120

## 2015-09-15 MED ORDER — BENZOCAINE-MENTHOL 20-0.5 % EX AERO
1.0000 "application " | INHALATION_SPRAY | CUTANEOUS | Status: DC | PRN
  Administered 2015-09-15: 1 via TOPICAL
  Filled 2015-09-15: qty 56

## 2015-09-15 MED ORDER — SODIUM CHLORIDE 0.9% FLUSH: 3.0000 mL | INTRAVENOUS | Status: DC | PRN

## 2015-09-15 MED ORDER — DIBUCAINE 1 % RE OINT: 1.0000 "application " | TOPICAL_OINTMENT | RECTAL | Status: DC | PRN

## 2015-09-15 MED ORDER — DIPHENHYDRAMINE HCL 25 MG PO CAPS: 25.0000 mg | ORAL_CAPSULE | Freq: Four times a day (QID) | ORAL | Status: DC | PRN

## 2015-09-15 MED ORDER — SIMETHICONE 80 MG PO CHEW: 80.0000 mg | CHEWABLE_TABLET | ORAL | Status: DC | PRN

## 2015-09-15 MED ORDER — MEASLES, MUMPS & RUBELLA VAC ~~LOC~~ INJ
0.5000 mL | INJECTION | Freq: Once | SUBCUTANEOUS | Status: AC
  Administered 2015-09-17: 0.5 mL via SUBCUTANEOUS
  Filled 2015-09-15: qty 0.5

## 2015-09-15 MED ORDER — SODIUM CHLORIDE 0.9 % IV SOLN
2.0000 g | Freq: Four times a day (QID) | INTRAVENOUS | Status: AC
  Administered 2015-09-15 – 2015-09-16 (×5): 2 g via INTRAVENOUS
  Filled 2015-09-15 (×5): qty 2000

## 2015-09-15 MED ORDER — ACETAMINOPHEN 325 MG PO TABS
650.0000 mg | ORAL_TABLET | ORAL | Status: DC | PRN
  Administered 2015-09-16: 650 mg via ORAL

## 2015-09-15 MED ORDER — SODIUM CHLORIDE 0.9% FLUSH: 3.0000 mL | Freq: Two times a day (BID) | INTRAVENOUS | Status: DC

## 2015-09-15 MED ORDER — ONDANSETRON HCL 4 MG/2ML IJ SOLN
4.0000 mg | INTRAMUSCULAR | Status: DC | PRN
Start: 2015-09-15 — End: 2015-09-17

## 2015-09-15 MED ORDER — OXYTOCIN 10 UNIT/ML IJ SOLN
1.0000 m[IU]/min | INTRAVENOUS | Status: DC
  Administered 2015-09-15: 10 m[IU]/min via INTRAVENOUS
  Administered 2015-09-15: 2 m[IU]/min via INTRAVENOUS
  Filled 2015-09-15: qty 4

## 2015-09-15 MED ORDER — ONDANSETRON HCL 4 MG PO TABS: 4.0000 mg | ORAL_TABLET | ORAL | Status: DC | PRN

## 2015-09-15 NOTE — Progress Notes (Signed)
Pharmacy Antibiotic Note  Jean NicolasSasha T Dougherty is a 25 y.o. female admitted on 09/14/2015 for PROM and TOLAC.  She has now developed a fever and suspected Triple I. Pharmacy has been consulted for gentamicin dosing.  Plan: Gentamicin 160 mg IV Q 8 hr Check SCr if gentamicin is continued postpartum  Height: 5\' 4"  (162.6 cm) Weight: 255 lb (115.667 kg) IBW/kg (Calculated) : 54.7 kg Dosing Weight: 73 kg  Temp (24hrs), Avg:98.8 F (37.1 C), Min:97.8 F (36.6 C), Max:102.4 F (39.1 C)   Recent Labs Lab 09/14/15 0900  WBC 4.4    CrCl cannot be calculated (Patient has no serum creatinine result on file.).    No Known Allergies  Antimicrobials this admission: Ampicillin 2 grams IV Q 6 hr started for Triple I    Thank you for allowing pharmacy to be a part of this patient's care.  Jean Dougherty, Jean Dougherty 09/15/2015 7:49 AM

## 2015-09-15 NOTE — Lactation Note (Signed)
This note was copied from a baby's chart. Lactation Consultation Note Initial visit at 8 hours of age.  Mom reports a few good feedings and has experience with older child breastfeeding for 3 months.   Baby quiet alert in moms arms.  Mom has soft breasts with everted nipples.  Mom is able to demonstrate proper hand expression with a few drops expressed.  Baby later began showing feeding cues and assisted with cross cradle hold on left breast and then mom held baby in cradle hold for a few strong sucks and then became too sleepy.  Encouraged mom to allow baby STS for feedings for a deep latch.  LC instructed mom on use of hand pump per moms requests.  [facility] resources given and discussed.  Encouraged to feed with early cues on demand.  Early newborn behavior discussed. Discussed supply and demand at length.  Mom to call for assist as needed.    Patient Name: Jean Dougherty [id number]'s Date: 09/15/2015 Reason for consult: Initial assessment   Maternal Data Has patient been taught Hand Expression?: Yes Does the patient have breastfeeding experience prior to this delivery?: Yes  Feeding Feeding Type: Breast Fed Length of feed:  (Few sucks)  LATCH Score/Interventions Latch: Grasps breast easily, tongue down, lips flanged, rhythmical sucking. Intervention(s): Adjust position;Assist with latch;Breast massage;Breast compression  Audible Swallowing: None  Type of Nipple: Everted at rest and after stimulation  Comfort (Breast/Nipple): Soft / non-tender     Hold (Positioning): No assistance needed to correctly position infant at breast.  LATCH Score: 8  Lactation Tools Discussed/Used [facility] Yes Pump Review: Setup, frequency, and cleaning Initiated by:: JS Date initiated:: 09/15/15   Consult Status Consult Status: Follow-up Date: 09/16/15 Follow-up type: In-patient    [name], [name] 09/15/2015, 5:06 PM

## 2015-09-15 NOTE — Progress Notes (Signed)
Kandra NicolasSasha T Ferran is a 25 y.o. G2P1001 at 5476w2d admitted for PROM  Subjective: Pt comfortable with epidural.  Family in room for support.  Objective: BP 129/79 mmHg  Pulse 89  Temp(Src) 98.5 F (36.9 C) (Oral)  Resp 18  Ht 5\' 4"  (1.626 m)  Wt 115.667 kg (255 lb)  BMI 43.75 kg/m2      FHT:  FHR: 135 bpm, variability: moderate,  accelerations:  Present,  decelerations:  Absent UC:   irregular, every 1-5 minutes SVE:   Dilation: 5 Effacement (%): 70 Station: -2 Exam by:: L.Leftwich-kirby CNM  Labs: Lab Results  Component Value Date   WBC 4.4 09/14/2015   HGB 12.6 09/14/2015   HCT 36.9 09/14/2015   MCV 83.9 09/14/2015   PLT 205 09/14/2015    Assessment / Plan: Augmentation of labor with arrest of labor  S/P FB  Labor: Start Pitocin @ 2 milliunits/min and increase by 2 per protocol. Preeclampsia:  n/a Fetal Wellbeing:  Category I Pain Control:  Epidural I/D:  n/a Anticipated MOD:  VBAC  LEFTWICH-KIRBY, Jordyan Hardiman 09/15/2015, 1:07 AM

## 2015-09-15 NOTE — Anesthesia Postprocedure Evaluation (Signed)
Anesthesia Post Note  Patient: Jean Dougherty  Procedure(s) Performed: * No procedures listed *  Patient location during evaluation: Mother Baby Anesthesia Type: Epidural Level of consciousness: awake and alert, oriented and patient cooperative Pain management: pain level controlled Vital Signs Assessment: post-procedure vital signs reviewed and stable Respiratory status: spontaneous breathing Cardiovascular status: stable Postop Assessment: no headache, epidural receding, patient able to bend at knees and no signs of nausea or vomiting Anesthetic complications: no Comments: Denies pain.     Last Vitals:  Filed Vitals:   09/15/15 1045 09/15/15 1230  BP: 112/78 115/58  Pulse: 128 97  Temp: 37.2 C 36.8 C  Resp: 20 18    Last Pain:  Filed Vitals:   09/15/15 1257  PainSc: 3    Pain Goal: Patients Stated Pain Goal: 8 (09/14/15 1515)               Merrilyn PumaWRINKLE,Sarabeth Benton

## 2015-09-16 ENCOUNTER — Inpatient Hospital Stay (HOSPITAL_COMMUNITY): Admission: RE | Admit: 2015-09-16 | Payer: Medicaid Other | Source: Ambulatory Visit

## 2015-09-16 ENCOUNTER — Encounter (HOSPITAL_COMMUNITY): Payer: Self-pay | Admitting: *Deleted

## 2015-09-16 DIAGNOSIS — O411290 Chorioamnionitis, unspecified trimester, not applicable or unspecified: Secondary | ICD-10-CM

## 2015-09-16 NOTE — Progress Notes (Signed)
Post Partum Day 1 Subjective: no complaints, up ad lib, voiding and tolerating PO  Objective: Blood pressure 111/82, pulse 82, temperature 97.7 F (36.5 C), temperature source Oral, resp. rate 18, height 5\' 4"  (1.626 m), weight 255 lb (115.667 kg), SpO2 100 %, unknown if currently breastfeeding.  Physical Exam:  General: alert, cooperative, appears stated age and no distress Lochia: appropriate Uterine Fundus: firm Incision: healing well, no significant drainage DVT Evaluation: No evidence of DVT seen on physical exam. Negative Homan's sign. No cords or calf tenderness.   Recent Labs  09/14/15 0900  HGB 12.6  HCT 36.9    Assessment/Plan: Plan for discharge tomorrow   LOS: 2 days   Jean Dougherty 09/16/2015, 6:49 AM

## 2015-09-17 MED ORDER — IBUPROFEN 600 MG PO TABS: 600.0000 mg | ORAL_TABLET | Freq: Four times a day (QID) | ORAL | Status: DC | PRN

## 2015-09-17 NOTE — Progress Notes (Signed)
Post Partum Day 2 Subjective:  Jean Dougherty is a 25 y.o. [id number] 38w2ds/p SVD.  No acute events overnight.  Pt denies problems with ambulating, voiding or po intake.  She denies nausea or vomiting.  Pain is well controlled.  She has had flatus. She has had bowel movement.  Lochia minimal.  Plan for birth control is oral contraceptives (estrogen/progesterone).  Method of Feeding: breast   Objective: Blood pressure 104/48, pulse 87, temperature 98.1 F (36.7 C), temperature source Oral, resp. rate 16, height '5\' 4"'$  (1.626 m), weight 115.667 kg (255 lb), SpO2 100 %, unknown if currently breastfeeding.  Physical Exam:  General: alert, cooperative and no distress Lochia:normal flow Chest: CTAB Heart: RRR no m/r/g Abdomen: +BS, soft, nontender Uterine Fundus: firm, below level of umbilicus DVT Evaluation: No evidence of DVT seen on physical exam. Extremities: No edema   Recent Labs  09/14/15 0900  HGB 12.6  HCT 36.9    Assessment/Plan:  ASSESSMENT: Jean FLORIDOis a 25y.o. [id number]/p SVD. Doing well, ready to go home.  Discharge home pending MMR  Wants to see lactation again    LOS: 3 days   Jean Dougherty/01/2016, 7:14 AM    I have seen and examined this patient and I agree with the above. See my Discharge Summary. Jean Dougherty 9:15 AM 09/17/2015

## 2015-09-17 NOTE — Discharge Summary (Signed)
OB Discharge Summary     Patient Name: Jean NicolasSasha T Dunford DOB: 06/27/1990 MRN: [id number]  Date of admission: 09/14/2015 Delivering MD: Wyvonnia DuskyLAWSON, MARIE D   Date of discharge: 09/17/2015  Admitting diagnosis: 40wks water broke Intrauterine pregnancy: 275w2d     Secondary diagnosis:  Active Problems:   Marijuana use   Rubella non-immune status, antepartum   Obesity   Previous cesarean delivery, antepartum condition or complication   Active labor   PROM (premature rupture of membranes)   Impaired glucose in pregnancy, antepartum   History of inadequate prenatal care   Chorioamnionitis, delivered, current hospitalization  Additional problems: none     Discharge diagnosis: VBAC                                                                                                Post partum procedures:none  Augmentation: Pitocin and Foley Balloon  Complications: Intrauterine Inflammation or infection (Chorioamniotis)  Hospital course:  Induction of Labor With Vaginal Delivery   25 y.o. yo [id number] at 3275w2d was admitted to the hospital 09/14/2015 for induction of labor.  Indication for induction: PROM.  Patient had an uncomplicated labor course as follows: Membrane Rupture Time/Date: 7:00 AM ,09/14/2015   Intrapartum Procedures: Episiotomy: None [1]                                         Lacerations:  2nd degree [3];Perineal [11]  Patient had delivery of a Viable infant.  Information for the patient's newborn:  Ethelene HalSmalls, Boy Shanay [[id number]][[id number]]  Delivery Method: VBAC, Spontaneous (Filed from Delivery Summary)   09/15/2015  Details of delivery can be found in separate delivery note.  Patient had a routine postpartum course. Patient is discharged home 09/17/2015.   Physical exam  Filed Vitals:   09/16/15 0100 09/16/15 0545 09/16/15 1828 09/17/15 0620  BP: 101/54 111/82 117/71 104/48  Pulse: 84 82 84 87  Temp: 97.7 F (36.5 C) 97.7 F (36.5 C) 98.3 F (36.8 C) 98.1 F (36.7 C)  TempSrc: Oral  Oral Oral Oral  Resp: 20 18 20 16   Height:      Weight:      SpO2: 100%      General: alert and cooperative Lochia: appropriate Uterine Fundus: firm Incision: N/A DVT Evaluation: No evidence of DVT seen on physical exam. Labs: Lab Results  Component Value Date   WBC 4.4 09/14/2015   HGB 12.6 09/14/2015   HCT 36.9 09/14/2015   MCV 83.9 09/14/2015   PLT 205 09/14/2015   CMP Latest Ref Rng 03/10/2012  Glucose 70 - 99 mg/dL 962(X106(H)  BUN 6 - 23 mg/dL 4(L)  Creatinine 5.280.50 - 1.10 mg/dL 4.130.65  Sodium 244135 - 010145 mEq/L 130(L)  Potassium 3.5 - 5.1 mEq/L 3.9  Chloride 96 - 112 mEq/L 98  CO2 19 - 32 mEq/L 23  Calcium 8.4 - 10.5 mg/dL 9.1  Total Protein 6.0 - 8.3 g/dL 2.7(O5.6(L)  Total Bilirubin 0.3 - 1.2 mg/dL 0.6  Alkaline Phos 39 - 117  U/L 131(H)  AST 0 - 37 U/L 25  ALT 0 - 35 U/L 12    Discharge instruction: per After Visit Summary and "Baby and Me Booklet".  After visit meds:    Medication List    TAKE these medications        ibuprofen 600 MG tablet  Commonly known as:  ADVIL,MOTRIN  Take 1 tablet (600 mg total) by mouth every 6 (six) hours as needed.     prenatal multivitamin Tabs tablet  Take 1 tablet by mouth daily at 12 noon.        Diet: routine diet  Activity: Advance as tolerated. Pelvic rest for 6 weeks.   Outpatient follow up:6 weeks Follow up Appt:No future appointments. Follow up Visit:No Follow-up on file.  Postpartum contraception: Combination OCPs  Newborn Data: Live born female  Birth Weight: 7 lb 11.3 oz (3495 g) APGAR: 7, 9  Baby Feeding: Breast Disposition:home with mother   09/17/2015 Cam Hai, CNM  9:14 AM

## 2015-09-17 NOTE — Lactation Note (Signed)
This note was copied from a baby's chart. Lactation Consultation Note  Mother recently gave baby 35 ml of formula. Discussed supply and demand and encouraged mother to breastfeed before offering formula. Mother attempted latching but baby sleepy.  Mother states she really did not want to try latching at this time. Discussed monitoring voids/stools, contacting WIC and answered questions regarding formula. Spoke w/ McCormick MD to let her know mother's choices.   Patient Name: Jean Dougherty's Date: 09/17/2015 Reason for consult: Follow-up assessment   Maternal Data    Feeding Feeding Type: Formula Nipple Type: Slow - flow  LATCH Score/Interventions                      Lactation Tools Discussed/Used     Consult Status Consult Status: Complete    Hardie PulleyBerkelhammer, Ruth Boschen 09/17/2015, 11:06 AM

## 2015-09-17 NOTE — Discharge Instructions (Signed)

## 2016-04-27 ENCOUNTER — Ambulatory Visit (INDEPENDENT_AMBULATORY_CARE_PROVIDER_SITE_OTHER): Payer: Medicaid Other | Admitting: Internal Medicine

## 2016-04-27 ENCOUNTER — Encounter: Payer: Self-pay | Admitting: Internal Medicine

## 2016-04-27 VITALS — BP 108/62 | HR 90 | Temp 98.3°F | Ht 64.0 in | Wt 221.0 lb

## 2016-04-27 DIAGNOSIS — Z8659 Personal history of other mental and behavioral disorders: Secondary | ICD-10-CM

## 2016-04-27 DIAGNOSIS — J302 Other seasonal allergic rhinitis: Secondary | ICD-10-CM

## 2016-04-27 DIAGNOSIS — Z30011 Encounter for initial prescription of contraceptive pills: Secondary | ICD-10-CM

## 2016-04-27 DIAGNOSIS — F319 Bipolar disorder, unspecified: Secondary | ICD-10-CM | POA: Insufficient documentation

## 2016-04-27 DIAGNOSIS — Z7689 Persons encountering health services in other specified circumstances: Secondary | ICD-10-CM

## 2016-04-27 HISTORY — DX: Other seasonal allergic rhinitis: J30.2

## 2016-04-27 LAB — POCT URINE PREGNANCY: PREG TEST UR: NEGATIVE

## 2016-04-27 MED ORDER — NORGESTIMATE-ETH ESTRADIOL 0.25-35 MG-MCG PO TABS: 1.0000 | ORAL_TABLET | Freq: Every day | ORAL | 11 refills | Status: DC

## 2016-04-27 NOTE — Assessment & Plan Note (Signed)
Not actively manic. Is interested in establishing care with a psychiatrist and therapist. Referral to psychiatry.

## 2016-04-27 NOTE — Patient Instructions (Signed)
We made a referral to psychiatry. I also sent birth control pack to the pharmacy

## 2016-04-27 NOTE — Progress Notes (Addendum)
   [name] Phone: [phone]   Date of Visit: 04/27/2016   HPI:  Patient presents today to establish care. Last PCP was the health department.   - reports of feeling depresseed  - mentally not present in the moment - decreased sleep. Sleeps about 4 hours. Feels like she does not need to sleep. During the day she has so much energy.  - reports of rapid mood changes.  - history of bipolar disorder diagnosed at age 25yo. Was followed by psychiatrist and was on a medication (name unknown) for a short period (not even a year). No particular reason she stopped the medication.  - no SI or HI  - history of wrist cutting around 25yo.   Patient is interested in restarting OCPs. She has been on OCP for a while but has run out. Cannot recall the brand. Was sexually active after her last period and did use condoms.   SH:  Sexually active uses condoms regularly  No history of STI   Meds:  None  Allergies:  None  FH:  Father: spinal stenosis Mother: bipolar, schizophrenia Grandmother, paternal: lung cancer Grandrather, paternal: prostrate  PMH: Obesity Marijuana Use Bipolar Disorder  Periods: yes, monthly  Contraception: birth control but has run out. Unsure of which brand she was on.  Pelvic symptoms: denies abnormal vaginal discharge, intermenstrual bleeding, vaginal itching.  Sexual activity: sexually active Pap smear status: in 2017 at HD; normal  Alcohol: every other weekend, < 3 drinks at a time   Drugs: denies  ROS: See HPI  PMFSH:  Cancers in family: updated in chart   PHYSICAL EXAM: BP 108/62   Pulse 90   Temp 98.3 F (36.8 C) (Oral)   Ht 5\' 4"  (1.626 m)   Wt 221 lb (100.2 kg)   LMP 04/10/2016   SpO2 98%   BMI 37.93 kg/m  Gen: NAD, pleasant, cooperative HEENT: NCAT, PERRL, no palpable thyromegaly or anterior cervical lymphadenopathy, pharynx normal  Heart: RRR, no murmurs Lungs: CTAB, NWOB Abdomen: soft, nontender to  palpation Neuro: grossly nonfocal, speech normal  PHQ9: 17, very difficult MDQ: Yes to all questions  ASSESSMENT/PLAN: - Patient signed release of information for Health Department and her psychiatrist.   1. History of bipolar disorder: diagnosed at age 25yo. Has not seen psychiatry since. No SI/HI. No overt signs of mania.  - Ambulatory referral to Psychiatry  2. Initiation of OCP (BCP) - Sprintec  - POCT urine pregnancy   Jean HolterKanishka G [name], MD PGY 2 [facility]

## 2016-05-19 ENCOUNTER — Encounter: Payer: Self-pay | Admitting: Internal Medicine

## 2016-05-19 DIAGNOSIS — Z0000 Encounter for general adult medical examination without abnormal findings: Secondary | ICD-10-CM | POA: Insufficient documentation

## 2016-06-04 ENCOUNTER — Encounter: Payer: Self-pay | Admitting: Family Medicine

## 2017-01-04 NOTE — Progress Notes (Deleted)
   [facility] Phone: [phone]   Date of Visit: 01/05/2017   HPI:  ***  ROS: See HPI.  PMFSH: ***  PHYSICAL EXAM: There were no vitals taken for this visit. Gen: *** HEENT: *** Heart: *** Lungs: *** Neuro: *** Ext: ***  ASSESSMENT/PLAN:  Health maintenance:  -***  No problem-specific Assessment & Plan notes found for this encounter.  FOLLOW UP: Follow up in *** for ***  [name], MD PGY 3 [facility]

## 2017-01-05 ENCOUNTER — Ambulatory Visit: Payer: Medicaid Other | Admitting: Internal Medicine

## 2017-03-09 NOTE — Progress Notes (Signed)
   [name] Phone: [phone]   Date of Visit: 03/10/2017   HPI:  Contraceptive management: -At her last visit patient was started on Sprintec for contraception. -She reports that since she started this medicine she has been having daily headaches and has had a weight gain as well.  She did a trial off of Sprintec and her headaches resolved.  Headaches restarted after she restarted the medication.  Bipolar disorder: -At her last visit she was interested in a referral to psychiatry for management of her bipolar disorder.  She went but that she did not need this service.  Instead she is seeing a therapist which is helping her a lot per patient  ROS: See HPI.  PMFSH: Obesity Marijuana Use Bipolar Disorder  PHYSICAL EXAM: BP 110/70   Pulse 84   Temp 98.3 F (36.8 C) (Oral)   Ht 5\' 4"  (1.626 m)   Wt 240 lb (108.9 kg)   LMP 02/22/2017   SpO2 98%   BMI 41.20 kg/m  GEN: NAD CV: RRR, no murmurs, rubs, or gallops PULM: CTAB, normal effort ABD: Soft, nontender, nondistended, NABS, no organomegaly SKIN: No rash or cyanosis; warm and well-perfused EXTR: No lower extremity edema or calf tenderness PSYCH: Mood and affect euthymic, normal rate and volume of speech NEURO: Awake, alert, no focal deficits grossly, normal speech   ASSESSMENT/PLAN:  Health maintenance:  -Declined flu vaccine  Contraceptive management: Patient has side effects with Sprintec.  Likely due to the estrogen in this OCP.  We discussed other methods such as IUD, Nexplanon, vaginal ring.  However she is not interested in any of these and would rather have a oral pill.  The only other option we have for her is Micronor.  I discussed that she needs to take this daily at the same time and ordered for it to be effective. -Micronor sent to pharmacy  Palma HolterKanishka G Lenore Moyano, MD PGY 3 [facility]

## 2017-03-10 ENCOUNTER — Encounter: Payer: Self-pay | Admitting: Internal Medicine

## 2017-03-10 ENCOUNTER — Ambulatory Visit (INDEPENDENT_AMBULATORY_CARE_PROVIDER_SITE_OTHER): Payer: Medicaid Other | Admitting: Internal Medicine

## 2017-03-10 VITALS — BP 110/70 | HR 84 | Temp 98.3°F | Ht 64.0 in | Wt 240.0 lb

## 2017-03-10 DIAGNOSIS — Z309 Encounter for contraceptive management, unspecified: Secondary | ICD-10-CM

## 2017-03-10 MED ORDER — NORETHINDRONE 0.35 MG PO TABS: 1.0000 | ORAL_TABLET | Freq: Every day | ORAL | 11 refills | Status: DC

## 2017-03-10 NOTE — Patient Instructions (Addendum)
Thank you for coming in.  Let's switch you to Micronor (one hormone pill) for birth control. Hopefully, your headaches will resolve with this change. Let me know if it doesnt

## 2017-09-29 ENCOUNTER — Ambulatory Visit (INDEPENDENT_AMBULATORY_CARE_PROVIDER_SITE_OTHER): Payer: Self-pay

## 2017-09-29 DIAGNOSIS — Z111 Encounter for screening for respiratory tuberculosis: Secondary | ICD-10-CM

## 2017-09-29 NOTE — Progress Notes (Signed)
   Patient in to nurse clinic for PPD skin test. Tuberculin skin test applied to right ventral forearm. Appointment made for 10/01/17 at 1030 to have PPD read. Reminder card given. Ples Specter, RN [facility] RN)

## 2017-10-01 ENCOUNTER — Ambulatory Visit: Payer: Self-pay

## 2017-10-01 DIAGNOSIS — Z111 Encounter for screening for respiratory tuberculosis: Secondary | ICD-10-CM

## 2017-10-01 LAB — TB SKIN TEST
INDURATION: 0 mm
TB SKIN TEST: NEGATIVE

## 2017-10-01 NOTE — Progress Notes (Signed)
   Patient here today to have PPD site read.   PPD read and results entered in Epic. Result: 0 mm induration. Interpretation: Negative. Letters given for employer. Nigel Mormon, RN

## 2017-11-01 ENCOUNTER — Ambulatory Visit: Payer: Medicaid Other

## 2017-11-03 ENCOUNTER — Ambulatory Visit (INDEPENDENT_AMBULATORY_CARE_PROVIDER_SITE_OTHER): Payer: Medicaid Other

## 2017-11-03 DIAGNOSIS — Z23 Encounter for immunization: Secondary | ICD-10-CM | POA: Diagnosis present

## 2017-11-03 NOTE — Progress Notes (Signed)
   Patient in to nurse clinic for a 2nd varicella vaccine required by her phlebotomy program. Vaccine given in right arm. Tolerated well. @ copies of NCIR given. Ples SpecterAlisa Reuel Lamadrid, RN [facility] RN)

## 2017-11-15 ENCOUNTER — Ambulatory Visit: Payer: Medicaid Other | Admitting: Family Medicine

## 2018-01-13 ENCOUNTER — Emergency Department (HOSPITAL_COMMUNITY): Payer: Medicaid Other

## 2018-01-13 ENCOUNTER — Emergency Department (HOSPITAL_COMMUNITY)
Admission: EM | Admit: 2018-01-13 | Discharge: 2018-01-13 | Disposition: A | Discharge status: Home / Self Care | Payer: Medicaid Other | Attending: Emergency Medicine | Admitting: Emergency Medicine

## 2018-01-13 ENCOUNTER — Other Ambulatory Visit: Payer: Self-pay

## 2018-01-13 ENCOUNTER — Ambulatory Visit: Payer: Medicaid Other

## 2018-01-13 ENCOUNTER — Encounter (HOSPITAL_COMMUNITY): Payer: Self-pay

## 2018-01-13 DIAGNOSIS — R1011 Right upper quadrant pain: Secondary | ICD-10-CM

## 2018-01-13 DIAGNOSIS — Z3A01 Less than 8 weeks gestation of pregnancy: Secondary | ICD-10-CM | POA: Diagnosis not present

## 2018-01-13 DIAGNOSIS — J45909 Unspecified asthma, uncomplicated: Secondary | ICD-10-CM | POA: Insufficient documentation

## 2018-01-13 DIAGNOSIS — R109 Unspecified abdominal pain: Secondary | ICD-10-CM

## 2018-01-13 DIAGNOSIS — O99511 Diseases of the respiratory system complicating pregnancy, first trimester: Secondary | ICD-10-CM | POA: Insufficient documentation

## 2018-01-13 DIAGNOSIS — O26891 Other specified pregnancy related conditions, first trimester: Secondary | ICD-10-CM | POA: Diagnosis present

## 2018-01-13 DIAGNOSIS — Z79899 Other long term (current) drug therapy: Secondary | ICD-10-CM | POA: Insufficient documentation

## 2018-01-13 LAB — WET PREP, GENITAL
CLUE CELLS WET PREP: NONE SEEN
Sperm: NONE SEEN
Trich, Wet Prep: NONE SEEN
Yeast Wet Prep HPF POC: NONE SEEN

## 2018-01-13 LAB — URINALYSIS, ROUTINE W REFLEX MICROSCOPIC
BILIRUBIN URINE: NEGATIVE
Glucose, UA: NEGATIVE mg/dL
HGB URINE DIPSTICK: NEGATIVE
Ketones, ur: NEGATIVE mg/dL
Leukocytes, UA: NEGATIVE
Nitrite: NEGATIVE
Protein, ur: 30 mg/dL — AB
SPECIFIC GRAVITY, URINE: 1.024 (ref 1.005–1.030)
pH: 8 (ref 5.0–8.0)

## 2018-01-13 LAB — COMPREHENSIVE METABOLIC PANEL
ALT: 22 U/L (ref 0–44)
ANION GAP: 8 (ref 5–15)
AST: 21 U/L (ref 15–41)
Albumin: 3.9 g/dL (ref 3.5–5.0)
Alkaline Phosphatase: 50 U/L (ref 38–126)
BILIRUBIN TOTAL: 0.8 mg/dL (ref 0.3–1.2)
BUN: 5 mg/dL — AB (ref 6–20)
CO2: 25 mmol/L (ref 22–32)
Calcium: 9.4 mg/dL (ref 8.9–10.3)
Chloride: 105 mmol/L (ref 98–111)
Creatinine, Ser: 0.76 mg/dL (ref 0.44–1.00)
GFR calc Af Amer: >60 mL/min (ref >60)
Glucose, Bld: 75 mg/dL (ref 70–99)
POTASSIUM: 3.7 mmol/L (ref 3.5–5.1)
Sodium: 138 mmol/L (ref 135–145)
TOTAL PROTEIN: 7 g/dL (ref 6.5–8.1)

## 2018-01-13 LAB — CBC
HEMATOCRIT: 40.4 % (ref 36.0–46.0)
HEMOGLOBIN: 13.5 g/dL (ref 12.0–15.0)
MCH: 30.1 pg (ref 26.0–34.0)
MCHC: 33.4 g/dL (ref 30.0–36.0)
MCV: 90 fL (ref 78.0–100.0)
Platelets: 247 10*3/uL (ref 150–400)
RBC: 4.49 MIL/uL (ref 3.87–5.11)
RDW: 12 % (ref 11.5–15.5)
WBC: 3.9 10*3/uL — AB (ref 4.0–10.5)

## 2018-01-13 LAB — LIPASE, BLOOD: Lipase: 25 U/L (ref 11–51)

## 2018-01-13 LAB — I-STAT BETA HCG BLOOD, ED (MC, WL, AP ONLY)

## 2018-01-13 LAB — POC OCCULT BLOOD, ED: Fecal Occult Bld: NEGATIVE

## 2018-01-13 MED ORDER — ONDANSETRON 4 MG PO TBDP: 4.0000 mg | ORAL_TABLET | Freq: Three times a day (TID) | ORAL | 0 refills | Status: DC | PRN

## 2018-01-13 MED ORDER — ONDANSETRON 4 MG PO TBDP
4.0000 mg | ORAL_TABLET | Freq: Once | ORAL | Status: AC | PRN
  Administered 2018-01-13: 4 mg via ORAL
  Filled 2018-01-13: qty 1

## 2018-01-13 MED ORDER — SODIUM CHLORIDE 0.9 % IV BOLUS
500.0000 mL | Freq: Once | INTRAVENOUS | Status: AC
  Administered 2018-01-13: 500 mL via INTRAVENOUS

## 2018-01-13 NOTE — ED Notes (Signed)
Pt informed she needs to provide a UA when possible. Pt given cup / verbalized understanding but does not have to go at this time.

## 2018-01-13 NOTE — Discharge Instructions (Addendum)
Please return to the Emergency Department for any new or worsening symptoms or if your symptoms do not improve. Please be sure to follow up with your Primary Care Physician as soon as possible regarding your visit today. If you do not have a Primary Doctor please use the resources below to establish one. Ultrasound today estimates your pregnancy to be approximately 6 weeks and 3 days along.  Please go to the women's outpatient clinic for an appointment and further evaluation of your pregnancy. You may use Zofran to help with nausea and vomiting. Please eat bland foods such as toast and bread, avoid spicy foods.  Please drink plenty of water to avoid dehydration.  Contact a health care provider if: Your abdominal pain changes or gets worse. You are not hungry or you lose weight without trying. You are constipated or have diarrhea for more than 2-3 days. You have pain when you urinate or have a bowel movement. Your abdominal pain wakes you up at night. Your pain gets worse with meals, after eating, or with certain foods. You are throwing up and cannot keep anything down. You have a fever. Get help right away if: Your pain does not go away as soon as your health care provider told you to expect. You have vaginal pain or bleeding You cannot stop throwing up. Your pain is only in areas of the abdomen, such as the right side or the left lower portion of the abdomen. You have bloody or black stools, or stools that look like tar. You have severe pain, cramping, or bloating in your abdomen. You have signs of dehydration, such as: Dark urine, very little urine, or no urine. Cracked lips. Dry mouth. Sunken eyes. Sleepiness. Weakness. Contact a health care provider if: You have dizziness. You have mild pelvic cramps, pelvic pressure, or nagging pain in the abdominal area. You have persistent nausea, vomiting, or diarrhea. You have a bad smelling vaginal discharge. You have pain when you  urinate. You notice increased swelling in your face, hands, legs, or ankles. You are exposed to fifth disease or chickenpox. You are exposed to [place] measles (rubella) and have never had it. Get help right away if: You have a fever. You are leaking fluid from your vagina. You have spotting or bleeding from your vagina. You have severe abdominal cramping or pain. You have rapid weight gain or loss. You vomit blood or material that looks like coffee grounds. You develop a severe headache. You have shortness of breath. You have any kind of trauma, such as from a fall or a car accident.  RESOURCE GUIDE  Chronic Pain Problems: Contact [facility]  [phone] Patients need to be referred by their primary care doctor.  Insufficient Money for Medicine: Contact United Way:  call "211" or [facility] ([phone].  No Primary Care Doctor: Call Health Connect  [phone] - can help you locate a primary care doctor that  accepts your insurance, provides certain services, etc. Physician Referral [phone]  Agencies that provide inexpensive medical care: [facility]  [phone] [facility]  [phone] [facility]  [phone] [facility]  [phone] Planned Parenthood  [phone] [facility]  [phone]  Medicaid-accepting [facility] Providers: [facility]- [address], [address]  [phone], Mon-Fri 9am-7pm, Sat 9am-1pm [facility]- [address] [place], [address] [place]  [phone] [facility]- [address], [address] [place]  [phone]  [facility]- [address]  [phone] Renaye Rakers- [address] [place], [address], [phone]  Only accepts [place] patients after they have their name  applied to their card  Self Pay (no insurance) in [facility]: Sickle Cell Patients: Dr Willey Blade, [facility] Internal  Medicine  [address] [place], [phone] [facility]- [address] [place]  [phone]       [facility] [place]- [address]       -     [facility]- see information above (Speak to [employer] if you do not have insurance)       -  [facility]- [address] [place], [phone]       -  [facility]- [address],  [phone]       -  [facility]- [address], [phone]       -  Dr Julio Sicks-  [address], [place], [phone]       -  [facility]- [address], [phone]       -  [facility]- [address] [phone], also [address], [phone]       -    [facility] [address] [place], [phone], 1st & 3rd Saturday   every month, 10am-1pm  1) Find a Doctor and Pay Out of Pocket Although you won't have to find out who is covered by your insurance plan, it is a good idea to ask around and get recommendations. You will then need to call the office and see if the doctor you have chosen will accept you as a new patient and what types of options they offer for patients who are self-pay. Some doctors offer discounts or will set up payment plans for their patients who do not have insurance, but you will need to ask so you aren't surprised when you get to your appointment.  2) Contact Your Local Health Department Not all health departments have doctors that can see patients for sick visits, but many do, so it is worth a call to see if yours does. If you don't know where your local health department is, you can check in your phone book. The CDC also has a tool to help you locate your state's health department, and many state websites also have listings of all of their local health departments.  3) Find a Walk-in Clinic If your illness is not likely to be very severe or complicated, you may want to try a walk in clinic. These are popping up all over the  country in pharmacies, drugstores, and shopping centers. They're usually staffed by nurse practitioners or physician assistants that have been trained to treat common illnesses and complaints. They're usually fairly quick and inexpensive. However, if you have serious medical issues or chronic medical problems, these are probably not your best option  STD Testing [facility] Department of [facility], STD Clinic, [address], [place], phone [phone] or [phone].  Monday - Friday, call for an appointment. [facility] Department of [employer], STD Clinic, [address], [place], phone [phone] or [phone].  Monday - Friday, call for an appointment.  Abuse/Neglect: [facility] Child Abuse Hotline 787-687-8756 [facility] Child Abuse Hotline 8380753953 (After Hours)  Emergency Shelter:  [place]  [employer] 581-145-3739  Maternity Homes: Room at the [place] [facility] 8542827128 [employer] Alert Services ([phone]  MRSA Hotline #:   725-576-0493  [facility] Resources  [facility] [place]  United Way [facility] [place] Dept. [address]                 [address]         [address] [place] [address]  Blondell Reveal Phone:  [phone]                                  Phone:  [phone]                   Phone:  [phone]  [facility], [phone] [facility]- [phone]       -     [facility] in [place], [address],                                  [phone], [facility] Child Abuse Hotline 682-792-2239 or 513-444-8420 (After Hours)   Behavioral Health Services  Substance Abuse Resources: Alcohol and Drug Services  831-056-7953 [facility]  9305893267 [facility] [place] [phone] Floydene Flock 9736934257 Residential & Outpatient Substance Abuse Program  630-089-3462  Psychological Services: [facility]  [phone] [facility] Services  [phone] [facility], [phone] [place]. [address] [place], ACCESS LINE: 406-024-7569 or (587)[phone], [employer]za  Dental Assistance  If unable to pay or uninsured, contact:  [facility] or [facility]. to become qualified for the adult dental clinic.  Patients with Medicaid: [facility] 9781087238 [address], [phone] [address] [phone]  If unable to pay, or uninsured, contact [facility] [phone]) or [facility] Department [phone] in [place], [phone] in [facility]) to become qualified for the adult dental clinic   Other Low-Cost Community Dental Services: [facility]- [address] [place], [place], [place], [address], [phone], [phone], 2nd and 4th Thursday of the month at 6:30am.  10 clients each day by appointment, can sometimes see walk-in patients if someone does not show for an appointment. [facility]- [address] Ether Griffins [place], [place], [address], [phone] [facility] [address] [place], [place], [address], [phone] [facility] Health Department- [phone] [facility] Health Department- [phone] [facility] [phone]

## 2018-01-13 NOTE — ED Provider Notes (Addendum)
[facility] EMERGENCY DEPARTMENT Provider Note   CSN: [id number] Arrival date & time: 01/13/18  1315     History   Chief Complaint Chief Complaint  Patient presents with  . Abdominal Pain    HPI Jean Dougherty is a 27 y.o. female presenting for 2 weeks of right upper quadrant pain, nausea, vomiting and diarrhea.  Patient describes her pain as a constant sharp/cramping that is worsened with food intake.  Patient states that her pain is moderate in severity at this time.  Patient has not taken anything for her pain.  Patient also endorses 2 weeks of nausea and vomiting, nonbloody.  Patient states that she has been vomiting every time she eats and is occasionally yellow in color. Patient states that she is also had occasional diarrhea denies blood in the stool but states that she has had one darker episode of stool this yesterday.  Patient states that last bowel movement was just prior to arrival and was normal color and consistency.  Patient also endorsing 2 weeks of lower abdominal cramping that is mild in severity, patient states that she thought these were menstrual cramps.  Patient states that her last menstrual period began on November 08, 2017.  Patient was not aware she was pregnant before I informed her today. Patient denies history of vaginal bleeding or discharge.  HPI  Past Medical History:  Diagnosis Date  . Asthma   . Bipolar disorder (HCC)   . Marijuana use     Patient Active Problem List   Diagnosis Date Noted  . Healthcare maintenance 05/19/2016  . Bipolar disorder (HCC) 04/27/2016  . Seasonal allergies 04/27/2016  . Impaired glucose in pregnancy, antepartum 09/15/2015  . History of inadequate prenatal care 09/15/2015  . Marijuana use 09/14/2015  . Obesity 09/14/2015    Past Surgical History:  Procedure Laterality Date  . CESAREAN SECTION  03/09/2012   Procedure: CESAREAN SECTION;  Surgeon: Antionette Char, MD;  Location: [facility];  Service:  Obstetrics;  Laterality: N/A;  . WISDOM TOOTH EXTRACTION       OB History    Gravida  2   Para  2   Term  2   Preterm  0   AB  0   Living  2     SAB  0   TAB  0   Ectopic  0   Multiple  0   Live Births  2            Home Medications    Prior to Admission medications   Medication Sig Start Date End Date Taking? Authorizing Provider  norethindrone (MICRONOR,CAMILA,ERRIN) 0.35 MG tablet Take 1 tablet (0.35 mg total) by mouth daily. 03/10/17   [name], MD  ondansetron (ZOFRAN ODT) 4 MG disintegrating tablet Take 1 tablet (4 mg total) by mouth every 8 (eight) hours as needed for nausea or vomiting. 01/13/18   Bill Salinas, PA-C    Family History Family History  Problem Relation Age of Onset  . Hypertension Father   . Diabetes Father   . Hypertension Maternal Uncle   . Cancer Maternal Grandmother        lung cancer   . Hypertension Maternal Grandfather   . Diabetes Maternal Grandfather   . Other Neg Hx     Social History Social History   Tobacco Use  . Smoking status: Never Smoker  . Smokeless tobacco: Never Used  Substance Use Topics  . Alcohol use: No  .  Drug use: No     Allergies   Patient has no known allergies.   Review of Systems Review of Systems  Constitutional: Negative.  Negative for chills and fever.  HENT: Negative.  Negative for rhinorrhea and sore throat.   Eyes: Negative.  Negative for visual disturbance.  Respiratory: Negative.  Negative for cough and shortness of breath.   Cardiovascular: Negative.  Negative for chest pain and leg swelling.  Gastrointestinal: Positive for abdominal pain, diarrhea, nausea and vomiting. Negative for blood in stool.  Genitourinary: Negative.  Negative for dysuria, flank pain, hematuria, pelvic pain, vaginal bleeding and vaginal discharge.  Musculoskeletal: Negative.  Negative for arthralgias and myalgias.  Skin: Negative.  Negative for rash.  Neurological: Negative.  Negative  for dizziness, syncope, weakness, numbness and headaches.     Physical Exam Updated Vital Signs BP (!) 123/50 (BP Location: Right Arm)   Pulse 70   Temp 98.7 F (37.1 C) (Oral)   Resp 18   Ht 5\' 4"  (1.626 m)   Wt 99.8 kg   LMP 11/10/2017 (Approximate)   SpO2 100%   BMI 37.76 kg/m   Physical Exam  Constitutional: She is oriented to person, place, and time. She appears well-developed and well-nourished. No distress.  HENT:  Head: Normocephalic and atraumatic.  Right Ear: External ear normal.  Left Ear: External ear normal.  Nose: Nose normal.  Eyes: Pupils are equal, round, and reactive to light. EOM are normal.  Neck: Trachea normal and normal range of motion. No tracheal deviation present.  Cardiovascular: Normal rate, regular rhythm, normal heart sounds and intact distal pulses.  Pulmonary/Chest: Effort normal and breath sounds normal. No respiratory distress.  Abdominal: Soft. There is tenderness in the right upper quadrant and suprapubic area. There is positive Murphy's sign. There is no rigidity, no rebound, no guarding, no CVA tenderness and no tenderness at McBurney's point.  Genitourinary: Rectum normal. Rectal exam shows no external hemorrhoid, no internal hemorrhoid, no mass, no tenderness and guaiac negative stool.  Genitourinary Comments: Pelvic and Rectal exams chaperoned by [facility] EMT. Sterile Gloves used during Examination.  Pelvic exam: normal external genitalia without evidence of trauma. VULVA: normal appearing vulva with no masses, tenderness or lesion. VAGINA: normal appearing vagina with normal color and discharge, no lesions. CERVIX: normal appearing cervix without lesions, cervical motion tenderness absent, cervical os closed without purulent discharge; vaginal discharge - is scant and clear/white in color. Wet prep and DNA probe for chlamydia and GC obtained.     Musculoskeletal: Normal range of motion.  Neurological: She is alert and oriented to  person, place, and time.  Skin: Skin is warm and dry.  Psychiatric: She has a normal mood and affect. Her behavior is normal.     ED Treatments / Results  Labs (all labs ordered are listed, but only abnormal results are displayed) Labs Reviewed  WET PREP, GENITAL - Abnormal; Notable for the following components:      Result Value   WBC, Wet Prep HPF POC FEW (*)    All other components within normal limits  COMPREHENSIVE METABOLIC PANEL - Abnormal; Notable for the following components:   BUN 5 (*)    All other components within normal limits  CBC - Abnormal; Notable for the following components:   WBC 3.9 (*)    All other components within normal limits  URINALYSIS, ROUTINE W REFLEX MICROSCOPIC - Abnormal; Notable for the following components:   APPearance HAZY (*)    Protein, ur 30 (*)  Bacteria, UA RARE (*)    All other components within normal limits  I-STAT BETA HCG BLOOD, ED ([facility], [facility], [facility] ONLY) - Abnormal; Notable for the following components:   I-stat hCG, quantitative >2,000.0 (*)    All other components within normal limits  URINE CULTURE  LIPASE, BLOOD  POC OCCULT BLOOD, ED  GC/CHLAMYDIA PROBE AMP ([place]) NOT AT [facility]    EKG None  Radiology US Abdomen Complete  Result Date: 01/13/2018 CLINICAL DATA:  Right upper quadrant abdomen pain with nausea and vomiting. EXAM: ABDOMEN ULTRASOUND COMPLETE COMPARISON:  None. FINDINGS: Gallbladder: No gallstones or wall thickening visualized. No sonographic Murphy sign noted by sonographer. Common bile duct: Diameter: 3.6 mm Liver: No focal lesion identified. Within normal limits in parenchymal echogenicity. Portal vein is patent on color Doppler imaging with normal direction of blood flow towards the liver. IVC: No abnormality visualized. Pancreas: Visualized portion unremarkable. Spleen: Size and appearance within normal limits. Right Kidney: Length: 11.3 cm. Echogenicity within normal limits. No mass or hydronephrosis  visualized. Left Kidney: Length: 10.9 cm. Echogenicity within normal limits. No mass or hydronephrosis visualized. Abdominal aorta: No aneurysm visualized. Other findings: None. IMPRESSION: Normal abdomen ultrasound.  No evidence of cholecystitis. Electronically Signed   By: Sherian Rein M.D.   On: 01/13/2018 19:53   US Ob Comp Less 14 Wks  Result Date: 01/13/2018 CLINICAL DATA:  Abdominal pain for 2 weeks with nausea and vomiting, beta HCG greater than 2000. EXAM: OBSTETRIC <14 WK [place] AND TRANSVAGINAL OB US TECHNIQUE: Both transabdominal and transvaginal ultrasound examinations were performed for complete evaluation of the gestation as well as the maternal uterus, adnexal regions, and pelvic cul-de-sac. Transvaginal technique was performed to assess early pregnancy. COMPARISON:  None. FINDINGS: Intrauterine gestational sac: Single Yolk sac:  Visualized. Embryo:  Visualized. Cardiac Activity: Visualized. Heart Rate: 121 bpm CRL:  5.7 mm   6 w   3 d                  [place] EDC: 09/05/2018 Subchorionic hemorrhage:  None visualized. Maternal uterus/adnexae: Normal IMPRESSION: Single live intrauterine gestation at 6 weeks 3 days. No complicating features. Electronically Signed   By: Tollie Eth M.D.   On: 01/13/2018 19:18   US Ob Transvaginal  Result Date: 01/13/2018 CLINICAL DATA:  Abdominal pain for 2 weeks with nausea and vomiting, beta HCG greater than 2000. EXAM: OBSTETRIC <14 WK [place] AND TRANSVAGINAL OB US TECHNIQUE: Both transabdominal and transvaginal ultrasound examinations were performed for complete evaluation of the gestation as well as the maternal uterus, adnexal regions, and pelvic cul-de-sac. Transvaginal technique was performed to assess early pregnancy. COMPARISON:  None. FINDINGS: Intrauterine gestational sac: Single Yolk sac:  Visualized. Embryo:  Visualized. Cardiac Activity: Visualized. Heart Rate: 121 bpm CRL:  5.7 mm   6 w   3 d                  [place] EDC: 09/05/2018 Subchorionic hemorrhage:  None  visualized. Maternal uterus/adnexae: Normal IMPRESSION: Single live intrauterine gestation at 6 weeks 3 days. No complicating features. Electronically Signed   By: Tollie Eth M.D.   On: 01/13/2018 19:18    Procedures Procedures (including critical care time)  Medications Ordered in ED Medications  ondansetron (ZOFRAN-ODT) disintegrating tablet 4 mg (4 mg Oral Given 01/13/18 1330)  sodium chloride 0.9 % bolus 500 mL (0 mLs Intravenous Stopped 01/13/18 1736)     Initial Impression / Assessment and Plan / ED Course  I  have reviewed the triage vital signs and the nursing notes.  Pertinent labs & imaging results that were available during my care of the patient were reviewed by me and considered in my medical decision making (see chart for details).  Clinical Course as of Jan 13 2025  Thu Jan 13, 2018  1635 Pelvic and rectal exam chaperoned by [facility] EMT.  During examination patient states that she took Pepto-Bismol yesterday which could account for her single episode of dark stool.  States that she has had normal color stool since the episode yesterday.   [BM]  2026 Patient drinking soda without difficulty in the emergency department, no vomiting.   [BM]    Clinical Course User Index [BM] Harlene Salts A, PA-C   Hemoccult negative Urinalysis with rare bacteria sent for culture no urinary symptoms. Lipase within normal limits CBC nonacute CMP nonacute Wet prep without trich yeast or clue cells GC chlamydia pending Pelvic exam none concerning for PID at this time.  Cervical loss closed no bleeding present. OB ultrasound shows IUP of 6 weeks 3 days. Abdominal ultrasound without acute findings.  27 year old female presenting for 2 weeks of abdominal pain. Patient is afebrile, non-toxic appearing, sitting comfortably on examination table. Patient's pain and other symptoms adequately managed in emergency department. Fluid bolus given. Labs, imaging and vitals reviewed.  Patient does not  meet the SIRS or Sepsis criteria.  On repeat exam patient does not have a surgical abdomin and there are no peritoneal signs.  No indication of appendicitis, bowel obstruction, bowel perforation, cholecystitis, diverticulitis, PID or ectopic pregnancy.  Ultrasound shows patient to be approximately 6 weeks 3 days pregnant, IUP.  Patient has been given follow-up with women's outpatient clinic.  Patient prescribed ODT Zofran for nausea.  Patient states understanding of diagnoses, sitting comfortably in bed no acute distress at this time.  Patient states that she feels well, denying pain/nausea or any other symptoms at this time.  At this time there does not appear to be any evidence of an acute emergency medical condition and the patient appears stable for discharge with appropriate outpatient follow up. Diagnosis was discussed with patient who verbalizes understanding of care plan and is agreeable to discharge. I have discussed return precautions with patient who verbalizes understanding of return precautions. Patient strongly encouraged to follow-up with their PCP. All questions answered.  Patient's case discussed with Dr. Denton Lank who agrees with plan to discharge with follow-up.     Note: Portions of this report may have been transcribed using voice recognition software. Every effort was made to ensure accuracy; however, inadvertent computerized transcription errors may still be present.  Final Clinical Impressions(s) / ED Diagnoses   Final diagnoses:  Right upper quadrant abdominal pain  Less than [redacted] weeks gestation of pregnancy    ED Discharge Orders         Ordered    ondansetron (ZOFRAN ODT) 4 MG disintegrating tablet  Every 8 hours PRN     01/13/18 2001           Bill Salinas, PA-C 01/13/18 2029    Bill Salinas, PA-C 01/13/18 2031    Cathren Laine, MD 01/13/18 2248

## 2018-01-13 NOTE — ED Notes (Signed)
Patient transported to US 

## 2018-01-13 NOTE — ED Triage Notes (Signed)
Pt presents with 2 week h/o RUQ abdominal pain, with nausea, vomiting and diarrhea.  Pt reports pain has been constant and does not radiate.  Pt reports 1 episode of black stool.

## 2018-01-13 NOTE — Progress Notes (Deleted)
   Subjective   Patient ID: Jean Dougherty    DOB: Jun 29, 1990, 27 y.o. female   MRN: [id number]  CC: "***"  HPI: Jean Dougherty is a 27 y.o. female who presents to clinic today for the following:  ***: ***  ROS: see HPI for pertinent.  PMFSH: Obesity, marijuana use, allergies, bipolar.  Surgical history C-section, wisdom tooth extraction.  Family history HTN, DM.  Smoking status reviewed. Medications reviewed.  Objective   There were no vitals taken for this visit. Vitals and nursing note reviewed.  General: well nourished, well developed, NAD with non-toxic appearance HEENT: normocephalic, atraumatic, moist mucous membranes Neck: supple, non-tender without lymphadenopathy Cardiovascular: regular rate and rhythm without murmurs, rubs, or gallops Lungs: clear to auscultation bilaterally with normal work of breathing Abdomen: soft, non-tender, non-distended, normoactive bowel sounds Skin: warm, dry, no rashes or lesions, cap refill < 2 seconds Extremities: warm and well perfused, normal tone, no edema  Assessment & Plan   No problem-specific Assessment & Plan notes found for this encounter.  No orders of the defined types were placed in this encounter.  No orders of the defined types were placed in this encounter.   [name], DO [facility], PGY-3 01/13/2018, 8:14 AM

## 2018-01-14 LAB — GC/CHLAMYDIA PROBE AMP (~~LOC~~) NOT AT ARMC
Chlamydia: NEGATIVE
Neisseria Gonorrhea: NEGATIVE

## 2018-01-15 LAB — URINE CULTURE

## 2018-02-10 ENCOUNTER — Other Ambulatory Visit: Payer: Self-pay

## 2018-02-10 ENCOUNTER — Inpatient Hospital Stay (HOSPITAL_COMMUNITY)
Admission: AD | Admit: 2018-02-10 | Discharge: 2018-02-10 | Disposition: A | Discharge status: Home / Self Care | Payer: Medicaid Other | Source: Ambulatory Visit | Attending: Family Medicine | Admitting: Family Medicine

## 2018-02-10 ENCOUNTER — Encounter (HOSPITAL_COMMUNITY): Payer: Self-pay | Admitting: *Deleted

## 2018-02-10 DIAGNOSIS — O359XX0 Maternal care for (suspected) fetal abnormality and damage, unspecified, not applicable or unspecified: Secondary | ICD-10-CM | POA: Diagnosis not present

## 2018-02-10 DIAGNOSIS — O34219 Maternal care for unspecified type scar from previous cesarean delivery: Secondary | ICD-10-CM | POA: Diagnosis not present

## 2018-02-10 DIAGNOSIS — Z801 Family history of malignant neoplasm of trachea, bronchus and lung: Secondary | ICD-10-CM | POA: Diagnosis not present

## 2018-02-10 DIAGNOSIS — Z8249 Family history of ischemic heart disease and other diseases of the circulatory system: Secondary | ICD-10-CM | POA: Insufficient documentation

## 2018-02-10 DIAGNOSIS — Z3A10 10 weeks gestation of pregnancy: Secondary | ICD-10-CM | POA: Insufficient documentation

## 2018-02-10 DIAGNOSIS — O99321 Drug use complicating pregnancy, first trimester: Secondary | ICD-10-CM | POA: Insufficient documentation

## 2018-02-10 DIAGNOSIS — Z9889 Other specified postprocedural states: Secondary | ICD-10-CM | POA: Insufficient documentation

## 2018-02-10 DIAGNOSIS — Z833 Family history of diabetes mellitus: Secondary | ICD-10-CM | POA: Insufficient documentation

## 2018-02-10 DIAGNOSIS — O219 Vomiting of pregnancy, unspecified: Secondary | ICD-10-CM | POA: Diagnosis present

## 2018-02-10 DIAGNOSIS — Z79899 Other long term (current) drug therapy: Secondary | ICD-10-CM | POA: Insufficient documentation

## 2018-02-10 MED ORDER — PROMETHAZINE HCL 25 MG PO TABS: 12.5000 mg | ORAL_TABLET | Freq: Four times a day (QID) | ORAL | 0 refills | Status: DC | PRN

## 2018-02-10 NOTE — Discharge Instructions (Signed)
First Trimester of Pregnancy The first trimester of pregnancy is from week 1 until the end of week 13 (months 1 through 3). A week after a sperm fertilizes an egg, the egg will implant on the wall of the uterus. This embryo will begin to develop into a baby. Genes from you and your partner will form the baby. The female genes will determine whether the baby will be a boy or a girl. At 6-8 weeks, the eyes and face will be formed, and the heartbeat can be seen on ultrasound. At the end of 12 weeks, all the baby's organs will be formed. Now that you are pregnant, you will want to do everything you can to have a healthy baby. Two of the most important things are to get good prenatal care and to follow your health care provider's instructions. Prenatal care is all the medical care you receive before the baby's birth. This care will help prevent, find, and treat any problems during the pregnancy and childbirth. Body changes during your first trimester Your body goes through many changes during pregnancy. The changes vary from woman to woman.  You may gain or lose a couple of pounds at first.  You may feel sick to your stomach (nauseous) and you may throw up (vomit). If the vomiting is uncontrollable, call your health care provider.  You may tire easily.  You may develop headaches that can be relieved by medicines. All medicines should be approved by your health care provider.  You may urinate more often. Painful urination may mean you have a bladder infection.  You may develop heartburn as a result of your pregnancy.  You may develop constipation because certain hormones are causing the muscles that push stool through your intestines to slow down.  You may develop hemorrhoids or swollen veins (varicose veins).  Your breasts may begin to grow larger and become tender. Your nipples may stick out more, and the tissue that surrounds them (areola) may become darker.  Your gums may bleed and may be  sensitive to brushing and flossing.  Dark spots or blotches (chloasma, mask of pregnancy) may develop on your face. This will likely fade after the baby is born.  Your menstrual periods will stop.  You may have a loss of appetite.  You may develop cravings for certain kinds of food.  You may have changes in your emotions from day to day, such as being excited to be pregnant or being concerned that something may go wrong with the pregnancy and baby.  You may have more vivid and strange dreams.  You may have changes in your hair. These can include thickening of your hair, rapid growth, and changes in texture. Some women also have hair loss during or after pregnancy, or hair that feels dry or thin. Your hair will most likely return to normal after your baby is born.  What to expect at prenatal visits During a routine prenatal visit:  You will be weighed to make sure you and the baby are growing normally.  Your blood pressure will be taken.  Your abdomen will be measured to track your baby's growth.  The fetal heartbeat will be listened to between weeks 10 and 14 of your pregnancy.  Test results from any previous visits will be discussed.  Your health care provider may ask you:  How you are feeling.  If you are feeling the baby move.  If you have had any abnormal symptoms, such as leaking fluid, bleeding, severe headaches,   or abdominal cramping.  If you are using any tobacco products, including cigarettes, chewing tobacco, and electronic cigarettes.  If you have any questions.  Other tests that may be performed during your first trimester include:  Blood tests to find your blood type and to check for the presence of any previous infections. The tests will also be used to check for low iron levels (anemia) and protein on red blood cells (Rh antibodies). Depending on your risk factors, or if you previously had diabetes during pregnancy, you may have tests to check for high blood  sugar that affects pregnant women (gestational diabetes).  Urine tests to check for infections, diabetes, or protein in the urine.  An ultrasound to confirm the proper growth and development of the baby.  Fetal screens for spinal cord problems (spina bifida) and Down syndrome.  HIV (human immunodeficiency virus) testing. Routine prenatal testing includes screening for HIV, unless you choose not to have this test.  You may need other tests to make sure you and the baby are doing well.  Follow these instructions at home: Medicines  Follow your health care provider's instructions regarding medicine use. Specific medicines may be either safe or unsafe to take during pregnancy.  Take a prenatal vitamin that contains at least 600 micrograms (mcg) of folic acid.  If you develop constipation, try taking a stool softener if your health care provider approves. Eating and drinking  Eat a balanced diet that includes fresh fruits and vegetables, whole grains, good sources of protein such as meat, eggs, or tofu, and low-fat dairy. Your health care provider will help you determine the amount of weight gain that is right for you.  Avoid raw meat and uncooked cheese. These carry germs that can cause birth defects in the baby.  Eating four or five small meals rather than three large meals a day may help relieve nausea and vomiting. If you start to feel nauseous, eating a few soda crackers can be helpful. Drinking liquids between meals, instead of during meals, also seems to help ease nausea and vomiting.  Limit foods that are high in fat and processed sugars, such as fried and sweet foods.  To prevent constipation: ? Eat foods that are high in fiber, such as fresh fruits and vegetables, whole grains, and beans. ? Drink enough fluid to keep your urine clear or pale yellow. Activity  Exercise only as directed by your health care provider. Most women can continue their usual exercise routine during  pregnancy. Try to exercise for 30 minutes at least 5 days a week. Exercising will help you: ? Control your weight. ? Stay in shape. ? Be prepared for labor and delivery.  Experiencing pain or cramping in the lower abdomen or lower back is a good sign that you should stop exercising. Check with your health care provider before continuing with normal exercises.  Try to avoid standing for long periods of time. Move your legs often if you must stand in one place for a long time.  Avoid heavy lifting.  Wear low-heeled shoes and practice good posture.  You may continue to have sex unless your health care provider tells you not to. Relieving pain and discomfort  Wear a good support bra to relieve breast tenderness.  Take warm sitz baths to soothe any pain or discomfort caused by hemorrhoids. Use hemorrhoid cream if your health care provider approves.  Rest with your legs elevated if you have leg cramps or low back pain.  If you develop   varicose veins in your legs, wear support hose. Elevate your feet for 15 minutes, 3-4 times a day. Limit salt in your diet. Prenatal care  Schedule your prenatal visits by the twelfth week of pregnancy. They are usually scheduled monthly at first, then more often in the last 2 months before delivery.  Write down your questions. Take them to your prenatal visits.  Keep all your prenatal visits as told by your health care provider. This is important. Safety  Wear your seat belt at all times when driving.  Make a list of emergency phone numbers, including numbers for family, friends, the hospital, and police and fire departments. General instructions  Ask your health care provider for a referral to a local prenatal education class. Begin classes no later than the beginning of month 6 of your pregnancy.  Ask for help if you have counseling or nutritional needs during pregnancy. Your health care provider can offer advice or refer you to specialists for help  with various needs.  Do not use hot tubs, steam rooms, or saunas.  Do not douche or use tampons or scented sanitary pads.  Do not cross your legs for long periods of time.  Avoid cat litter boxes and soil used by cats. These carry germs that can cause birth defects in the baby and possibly loss of the fetus by miscarriage or stillbirth.  Avoid all smoking, herbs, alcohol, and medicines not prescribed by your health care provider. Chemicals in these products affect the formation and growth of the baby.  Do not use any products that contain nicotine or tobacco, such as cigarettes and e-cigarettes. If you need help quitting, ask your health care provider. You may receive counseling support and other resources to help you quit.  Schedule a dentist appointment. At home, brush your teeth with a soft toothbrush and be gentle when you floss. Contact a health care provider if:  You have dizziness.  You have mild pelvic cramps, pelvic pressure, or nagging pain in the abdominal area.  You have persistent nausea, vomiting, or diarrhea.  You have a bad smelling vaginal discharge.  You have pain when you urinate.  You notice increased swelling in your face, hands, legs, or ankles.  You are exposed to fifth disease or chickenpox.  You are exposed to German measles (rubella) and have never had it. Get help right away if:  You have a fever.  You are leaking fluid from your vagina.  You have spotting or bleeding from your vagina.  You have severe abdominal cramping or pain.  You have rapid weight gain or loss.  You vomit blood or material that looks like coffee grounds.  You develop a severe headache.  You have shortness of breath.  You have any kind of trauma, such as from a fall or a car accident. Summary  The first trimester of pregnancy is from week 1 until the end of week 13 (months 1 through 3).  Your body goes through many changes during pregnancy. The changes vary from  woman to woman.  You will have routine prenatal visits. During those visits, your health care provider will examine you, discuss any test results you may have, and talk with you about how you are feeling. This information is not intended to replace advice given to you by your health care provider. Make sure you discuss any questions you have with your health care provider. Document Released: 04/21/2001 Document Revised: 04/08/2016 Document Reviewed: 04/08/2016 Elsevier Interactive Patient Education  2018 Elsevier   Inc[employer]  

## 2018-02-10 NOTE — MAU Provider Note (Signed)
History     CSN: [id number]  Arrival date and time: 02/10/18 1614   First Provider Initiated Contact with Patient 02/10/18 1647      Chief Complaint  Patient presents with  . Emesis   Jean Dougherty is a 27 y.o. G3P2002 at [redacted]w[redacted]d who presents today with vomiting x 1 today. She states that on 01/28/18 she took medications mifepristone and misoprostol for medication abortion. She states that after taking she had some bleeding, but not a significant amount. She felt sick yesterday and today, so she took a pregnancy test. The pregnancy test was +, so she went back to the abortion provider, and they did an [place]. They saw a viable IUP that was consistent with prior dating. The patient was offered aspiration abortion at that time. However she refused as she now wishes to continue the pregnancy.   Emesis   This is a new problem. The current episode started yesterday. The problem occurs less than 2 times per day. The problem has been unchanged. The emesis has an appearance of stomach contents. There has been no fever. Pertinent negatives include no chills or fever. Risk factors: pregnancy  Treatments tried: zofran  The treatment provided no relief.    OB History    Gravida  3   Para  2   Term  2   Preterm  0   AB  0   Living  2     SAB  0   TAB  0   Ectopic  0   Multiple  0   Live Births  2           Past Medical History:  Diagnosis Date  . Asthma   . Bipolar disorder (HCC)   . Marijuana use     Past Surgical History:  Procedure Laterality Date  . CESAREAN SECTION  03/09/2012   Procedure: CESAREAN SECTION;  Surgeon: Antionette Char, MD;  Location: [facility];  Service: Obstetrics;  Laterality: N/A;  . WISDOM TOOTH EXTRACTION      Family History  Problem Relation Age of Onset  . Hypertension Father   . Diabetes Father   . Hypertension Maternal Uncle   . Cancer Maternal Grandmother        lung cancer   . Hypertension Maternal Grandfather   . Diabetes Maternal  Grandfather   . Other Neg Hx     Social History   Tobacco Use  . Smoking status: Never Smoker  . Smokeless tobacco: Never Used  Substance Use Topics  . Alcohol use: No  . Drug use: No    Allergies: No Known Allergies  Medications Prior to Admission  Medication Sig Dispense Refill Last Dose  . norethindrone (MICRONOR,CAMILA,ERRIN) 0.35 MG tablet Take 1 tablet (0.35 mg total) by mouth daily. 1 Package 11   . ondansetron (ZOFRAN ODT) 4 MG disintegrating tablet Take 1 tablet (4 mg total) by mouth every 8 (eight) hours as needed for nausea or vomiting. 12 tablet 0     Review of Systems  Constitutional: Negative for chills and fever.  Gastrointestinal: Positive for nausea and vomiting.  Genitourinary: Negative for dysuria, pelvic pain, vaginal bleeding and vaginal discharge.   Physical Exam   Last menstrual period 12/09/2017, unknown if currently breastfeeding.  Physical Exam  Nursing note and vitals reviewed. Constitutional: She is oriented to person, place, and time. She appears well-developed and well-nourished. No distress.  HENT:  Head: Normocephalic.  Cardiovascular: Normal rate.  Respiratory: Effort normal.  GI: Soft.  There is no tenderness.  Neurological: She is alert and oriented to person, place, and time.  Skin: Skin is warm and dry.  Psychiatric: She has a normal mood and affect.    Pt informed that the ultrasound is considered a limited OB ultrasound and is not intended to be a complete ultrasound exam.  Patient also informed that the ultrasound is not being completed with the intent of assessing for fetal or placental anomalies or any pelvic abnormalities.  Explained that the purpose of today's ultrasound is to assess for  viability.  Patient acknowledges the purpose of the exam and the limitations of the study.   +FCA 140 bpm CRL 10 weeks 3 days, consistent with prior dating   MAU Course  Procedures  MDM Per UptoDate: "Rarely, a woman undergoing  medication abortion has a continuing pregnancy that is not recognized or chooses to continue a pregnancy after a failed medication abortion. Because of potential teratogenic risk of prostaglandins, aspiration abortion should be strongly advised in cases of failed pregnancy termination. We are unaware of data to suggest that exposure to mifepristone alone is teratogenic. A review of 71 cases of continuing pregnancy reported malformations in eight cases that had received mifepristone-based medication abortion regimens, and all but one included exposure to the prostaglandin gemeprost as part of the medication abortion regimen. Prostaglandins, notably misoprostol, have been associated with the development of congenital abnormalities in retrospective studies. The malformations have included scalp or skull defects, cranial nerve palsies (Moebius syndrome), and limb deficiencies (eg, equinovarus). The rise in uterine pressure related to uterine contractions or vascular spasm may be the mechanism contributing to these teratogenic effects." Reviewed this information with the patient today. She understands, and desires to continue the pregnancy at this time.  Assessment and Plan   1. Nausea/vomiting in pregnancy   2. [redacted] weeks gestation of pregnancy   3. 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   Contact information: [address] [place] [address] [phone]           Thressa Sheller 02/10/2018, 4:48 PM

## 2018-02-10 NOTE — MAU Note (Signed)
Pt reports she found out she was pregnant in early Sept, seen in [facility] to terminate the pregnancy but states she still has a positive home preg test and went back to clinic but the u/s showed the baby was alive . Pt is having a lot of nausea and vomiting and wants to get meds

## 2018-02-11 ENCOUNTER — Other Ambulatory Visit (HOSPITAL_COMMUNITY)
Admission: RE | Admit: 2018-02-11 | Discharge: 2018-02-11 | Disposition: A | Discharge status: Home / Self Care | Payer: Medicaid Other | Source: Ambulatory Visit | Attending: Obstetrics and Gynecology | Admitting: Obstetrics and Gynecology

## 2018-02-11 ENCOUNTER — Ambulatory Visit (INDEPENDENT_AMBULATORY_CARE_PROVIDER_SITE_OTHER): Payer: Medicaid Other | Admitting: Obstetrics and Gynecology

## 2018-02-11 ENCOUNTER — Encounter: Payer: Self-pay | Admitting: Obstetrics and Gynecology

## 2018-02-11 VITALS — BP 124/78 | HR 85 | Wt 253.2 lb

## 2018-02-11 DIAGNOSIS — O0991 Supervision of high risk pregnancy, unspecified, first trimester: Secondary | ICD-10-CM

## 2018-02-11 DIAGNOSIS — F319 Bipolar disorder, unspecified: Secondary | ICD-10-CM

## 2018-02-11 DIAGNOSIS — O99211 Obesity complicating pregnancy, first trimester: Secondary | ICD-10-CM | POA: Diagnosis not present

## 2018-02-11 DIAGNOSIS — J4520 Mild intermittent asthma, uncomplicated: Secondary | ICD-10-CM

## 2018-02-11 DIAGNOSIS — Z3A10 10 weeks gestation of pregnancy: Secondary | ICD-10-CM | POA: Insufficient documentation

## 2018-02-11 DIAGNOSIS — O0990 Supervision of high risk pregnancy, unspecified, unspecified trimester: Secondary | ICD-10-CM | POA: Insufficient documentation

## 2018-02-11 DIAGNOSIS — Z3481 Encounter for supervision of other normal pregnancy, first trimester: Secondary | ICD-10-CM

## 2018-02-11 DIAGNOSIS — O074 Failed attempted termination of pregnancy without complication: Secondary | ICD-10-CM

## 2018-02-11 DIAGNOSIS — Z98891 History of uterine scar from previous surgery: Secondary | ICD-10-CM

## 2018-02-11 HISTORY — DX: Failed attempted termination of pregnancy without complication: O07.4

## 2018-02-11 NOTE — Progress Notes (Signed)
Pt is here for initial OB visit. Pt took medication for medication abortion on 01/28/18 and had some bleeding, however [place] on 02/09/18 showed a viable IUP. Pt offered aspiration abortion, but patient refused and wishes to continue pregnancy. Pt was at MAU yesterday for N/V and given phenergan and zofran. Pt states the medication is helping with her symptoms.

## 2018-02-11 NOTE — Progress Notes (Signed)
INITIAL PRENATAL VISIT NOTE  Subjective:  Jean Dougherty is a 27 y.o. G3P2002 at [redacted]w[redacted]d by 1st trim [place] being seen today for her initial prenatal visit. This is an unplanned pregnancy. Attempted to terminate this pregnancy with mifepristone/misoprostol at 8 weeks, failed and now desires to continue pregnancy. She has an obstetric history significant for LTCS, VBAC. She has a medical history significant for asthma, bipolar.  Patient reports no complaints.  Contractions: Not present. Vag. Bleeding: None.   . Denies leaking of fluid.   Past Medical History:  Diagnosis Date  . Asthma   . Bipolar disorder (HCC)   . Marijuana use     Past Surgical History:  Procedure Laterality Date  . CESAREAN SECTION  03/09/2012   Procedure: CESAREAN SECTION;  Surgeon: Antionette Char, MD;  Location: [facility];  Service: Obstetrics;  Laterality: N/A;  . WISDOM TOOTH EXTRACTION      OB History  Gravida Para Term Preterm AB Living  3 2 2  0 0 2  SAB TAB Ectopic Multiple Live Births  0 0 0 0 2    # Outcome Date GA Lbr Len/2nd Weight Sex Delivery Anes PTL Lv  3 Current           2 Term 09/15/15 [redacted]w[redacted]d 23:40 / 01:17 7 lb 11.3 oz (3.495 kg) M VBAC EPI  LIV  1 Term 03/09/12 [redacted]w[redacted]d  7 lb 6.9 oz (3.37 kg) F CS-LTranv EPI  LIV    Social History   Socioeconomic History  . Marital status: Single    Spouse name: Not on file  . Number of children: Not on file  . Years of education: Not on file  . Highest education level: Not on file  Occupational History  . Occupation: CNA  Social Needs  . Financial resource strain: Not hard at all  . Food insecurity:    Worry: Never true    Inability: Never true  . Transportation needs:    Medical: No    Non-medical: No  Tobacco Use  . Smoking status: Never Smoker  . Smokeless tobacco: Never Used  Substance and Sexual Activity  . Alcohol use: No  . Drug use: No  . Sexual activity: Yes    Birth control/protection: None  Lifestyle  . Physical activity:   Days per week: Not on file    Minutes per session: Not on file  . Stress: Not on file  Relationships  . Social connections:    Talks on phone: Not on file    Gets together: Not on file    Attends religious service: Not on file    Active member of club or organization: Not on file    Attends meetings of clubs or organizations: Not on file    Relationship status: Not on file  Other Topics Concern  . Not on file  Social History Narrative   Works as a Lawyer. Has two children 24 months old and 66 years old     Family History  Problem Relation Age of Onset  . Hypertension Father   . Diabetes Father   . Hypertension Maternal Uncle   . Cancer Maternal Grandmother        lung cancer   . Hypertension Maternal Grandfather   . Diabetes Maternal Grandfather   . Other Neg Hx      Current Outpatient Medications:  .  ondansetron (ZOFRAN ODT) 4 MG disintegrating tablet, Take 1 tablet (4 mg total) by mouth every 8 (eight) hours as needed  for nausea or vomiting., Disp: 12 tablet, Rfl: 0 .  Prenatal Vit-Fe Fumarate-FA (MULTIVITAMIN-PRENATAL) 27-0.8 MG TABS tablet, Take 1 tablet by mouth daily at 12 noon., Disp: , Rfl:  .  promethazine (PHENERGAN) 25 MG tablet, Take 0.5-1 tablets (12.5-25 mg total) by mouth every 6 (six) hours as needed., Disp: 30 tablet, Rfl: 0  No Known Allergies  Review of Systems: Negative except for what is mentioned in HPI.  Objective:   Vitals:   02/11/18 1014  BP: 124/78  Pulse: 85  Weight: 253 lb 3.2 oz (114.9 kg)    Fetal Status:         bedside US: fetal movement seen, appropriate FHR noted  Physical Exam: BP 124/78   Pulse 85   Wt 253 lb 3.2 oz (114.9 kg)   LMP 12/09/2017 (Approximate)   BMI 43.46 kg/m  CONSTITUTIONAL: Well-developed, well-nourished female in no acute distress.  NEUROLOGIC: Alert and oriented to person, place, and time. Normal reflexes, muscle tone coordination. No cranial nerve deficit noted. PSYCHIATRIC: Normal mood and affect. Normal  behavior. Normal judgment and thought content. SKIN: Skin is warm and dry. No rash noted. Not diaphoretic. No erythema. No pallor. HENT:  Normocephalic, atraumatic, External right and left ear normal. Oropharynx is clear and moist EYES: Conjunctivae and EOM are normal. Pupils are equal, round, and reactive to light. No scleral icterus.  NECK: Normal range of motion, supple, no masses CARDIOVASCULAR: Normal heart rate noted, regular rhythm RESPIRATORY: Effort and breath sounds normal, no problems with respiration noted BREASTS: symmetric, non-tender, no masses palpable ABDOMEN: Soft, nontender, nondistended, gravid, high transverse incision above pannus fold. GU: normal appearing external female genitalia, multiparous normal appearing cervix, scant white discharge in vagina, no lesions noted Bimanual: unable to palpate uterus 2/2 habitus, no adnexal tenderness or masses noted MUSCULOSKELETAL: Normal range of motion. EXT:  No edema and no tenderness. 2+ distal pulses.  Pt informed that the ultrasound is considered a limited OB ultrasound and is not intended to be a complete ultrasound exam.  Patient also informed that the ultrasound is not being completed with the intent of assessing for fetal or placental anomalies or any pelvic abnormalities.  Explained that the purpose of today's ultrasound is to assess for  FHT.  Patient acknowledges the purpose of the exam and the limitations of the study.    Assessment and Plan:  Pregnancy: G3P2002 at [redacted]w[redacted]d by 1st trim [place]  1. Supervision of high risk pregnancy, antepartum - Obstetric Panel, Including HIV - Culture, OB Urine - Cytology - PAP - Hemoglobinopathy evaluation - Cystic Fibrosis Mutation 97 - SMN1 COPY NUMBER ANALYSIS (SMA Carrier Screen) - Cervicovaginal ancillary only - Genetic Screening  2. Failed attempted termination of pregnancy without complication - Desires to keep pregnancy - Previously counseled regarding about potential  malformations/anomalies, verbalizes understanding of possibility and no interventions available now  3. H/O: C-section Arrest of dilation S/p VBAC Would like to VBAC again  4. Obesity affecting pregnancy in first trimester - per EMR, h/o abnormal GTT in prior pregnancy, patient denies h/o gDM - Hemoglobin A1c  5. Bipolar affective disorder, remission status unspecified (HCC) - On medication prior to pregnancy, does not remember what and discontinued on her own earlier this year - states she feels stable now - offered behavioral health prn, patient will call if desired  6. Mild intermittent asthma without complication Stable, rarely uses inhaler   Preterm labor symptoms and general obstetric precautions including but not limited to vaginal bleeding, contractions, leaking  of fluid and fetal movement were reviewed in detail with the patient.  Please refer to After Visit Summary for other counseling recommendations.   Return in about 4 weeks (around 03/11/2018) for OB visit.  Conan Bowens 02/11/2018 11:25 AM

## 2018-02-13 LAB — URINE CULTURE, OB REFLEX

## 2018-02-13 LAB — CULTURE, OB URINE

## 2018-02-14 LAB — HEMOGLOBIN A1C
ESTIMATED AVERAGE GLUCOSE: 97 mg/dL
Hgb A1c MFr Bld: 5 % (ref 4.8–5.6)

## 2018-02-14 LAB — OBSTETRIC PANEL, INCLUDING HIV
ANTIBODY SCREEN: NEGATIVE
BASOS: 0 %
Basophils Absolute: 0 10*3/uL (ref 0.0–0.2)
EOS (ABSOLUTE): 0.1 10*3/uL (ref 0.0–0.4)
EOS: 2 %
HEMATOCRIT: 37.6 % (ref 34.0–46.6)
HEMOGLOBIN: 12.6 g/dL (ref 11.1–15.9)
HEP B S AG: NEGATIVE
HIV Screen 4th Generation wRfx: NONREACTIVE
IMMATURE GRANS (ABS): 0 10*3/uL (ref 0.0–0.1)
Immature Granulocytes: 0 %
LYMPHS: 32 %
Lymphocytes Absolute: 1.5 10*3/uL (ref 0.7–3.1)
MCH: 30.2 pg (ref 26.6–33.0)
MCHC: 33.5 g/dL (ref 31.5–35.7)
MCV: 90 fL (ref 79–97)
MONOCYTES: 14 %
Monocytes Absolute: 0.7 10*3/uL (ref 0.1–0.9)
Neutrophils Absolute: 2.4 10*3/uL (ref 1.4–7.0)
Neutrophils: 52 %
Platelets: 242 10*3/uL (ref 150–450)
RBC: 4.17 x10E6/uL (ref 3.77–5.28)
RDW: 12.8 % (ref 12.3–15.4)
RPR: NONREACTIVE
RUBELLA: 1.74 {index} (ref >0.99)
Rh Factor: POSITIVE
WBC: 4.6 10*3/uL (ref 3.4–10.8)

## 2018-02-14 LAB — CERVICOVAGINAL ANCILLARY ONLY
CHLAMYDIA, DNA PROBE: NEGATIVE
NEISSERIA GONORRHEA: NEGATIVE
TRICH (WINDOWPATH): NEGATIVE

## 2018-02-14 LAB — CYTOLOGY - PAP: Diagnosis: NEGATIVE

## 2018-02-14 LAB — HEMOGLOBINOPATHY EVALUATION
HEMOGLOBIN A2 QUANTITATION: 2.7 % (ref 1.8–3.2)
HEMOGLOBIN F QUANTITATION: 0.5 % (ref 0.0–2.0)
HGB A: 96.8 % (ref 96.4–98.8)
HGB C: 0 %
HGB S: 0 %
HGB VARIANT: 0 %

## 2018-02-18 LAB — SMN1 COPY NUMBER ANALYSIS (SMA CARRIER SCREENING)

## 2018-02-21 LAB — CYSTIC FIBROSIS MUTATION 97: GENE DIS ANAL CARRIER INTERP BLD/T-IMP: NOT DETECTED

## 2018-03-11 ENCOUNTER — Encounter: Payer: Medicaid Other | Admitting: Obstetrics and Gynecology

## 2018-03-15 ENCOUNTER — Telehealth: Payer: Self-pay | Admitting: *Deleted

## 2018-03-15 NOTE — Telephone Encounter (Signed)
Called pt to see if she wants to r/s her ROB appt. She states she didn't want to schedule at this time. Told pat I will inform the provider

## 2018-03-23 ENCOUNTER — Encounter: Payer: Self-pay | Admitting: Obstetrics and Gynecology

## 2018-07-25 ENCOUNTER — Other Ambulatory Visit: Payer: Self-pay

## 2018-07-25 MED ORDER — NORETHINDRONE 0.35 MG PO TABS: 1.0000 | ORAL_TABLET | Freq: Every day | ORAL | 11 refills | Status: DC

## 2018-09-01 ENCOUNTER — Encounter: Payer: Self-pay | Admitting: Obstetrics and Gynecology

## 2018-11-28 ENCOUNTER — Encounter (HOSPITAL_COMMUNITY): Payer: Self-pay

## 2019-05-10 IMAGING — US US ABDOMEN COMPLETE
1 series · 14 of 25 positions shown · non-contrast
Comparison: None.

CLINICAL DATA: Right upper quadrant abdomen pain with nausea and
vomiting.

EXAM:
ABDOMEN ULTRASOUND COMPLETE

[Series 1: us abdomen complete · 0.26mm/px · 14 of 117 slices shown]
[im 1/117]
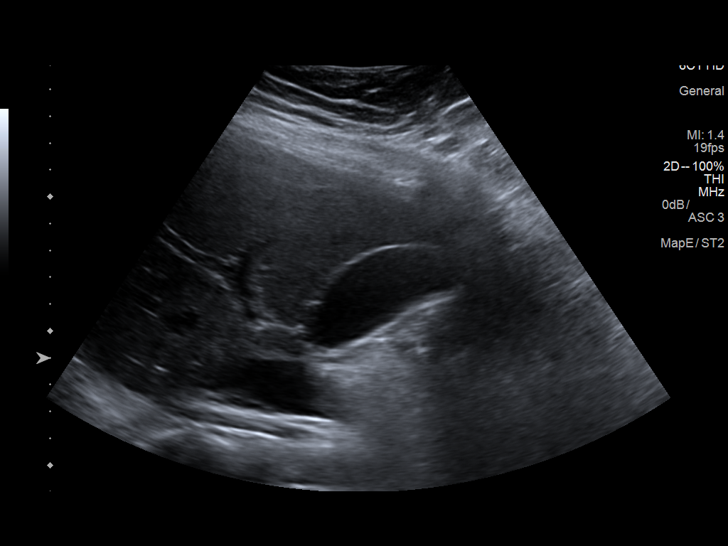
[im 10/117]
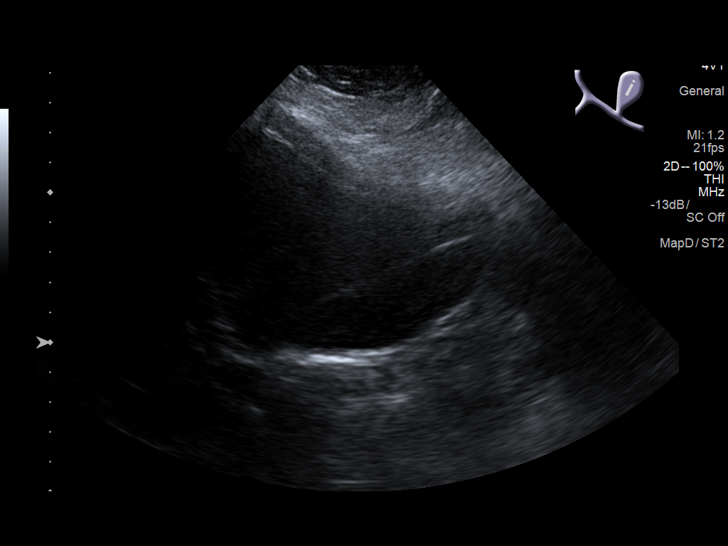
[im 20/117]
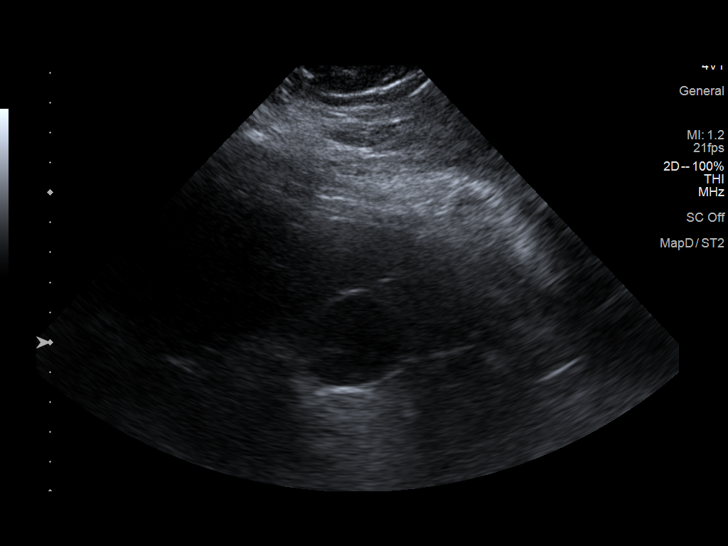
[im 30/117]
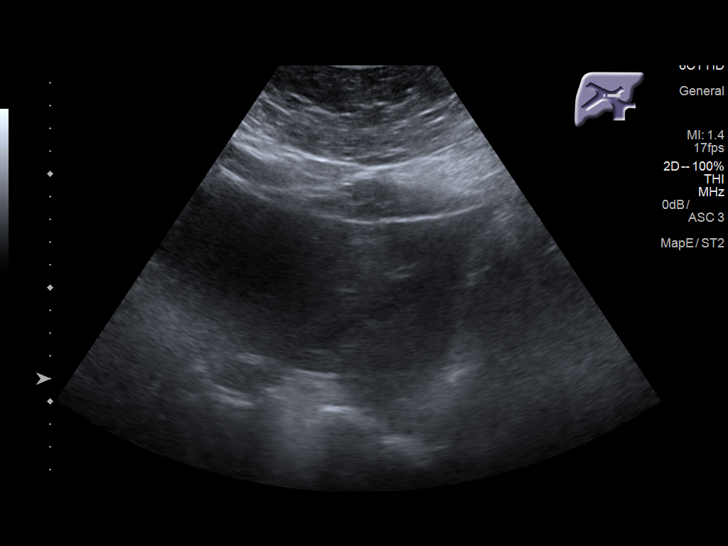
[im 39/117]
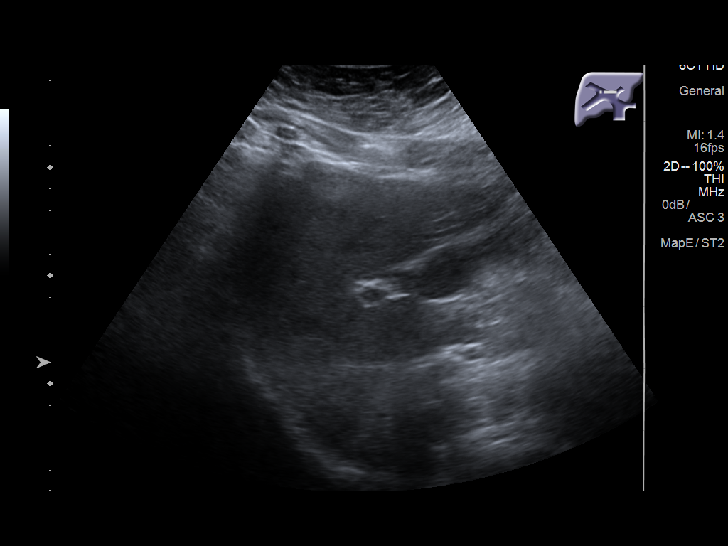
[im 44/117]
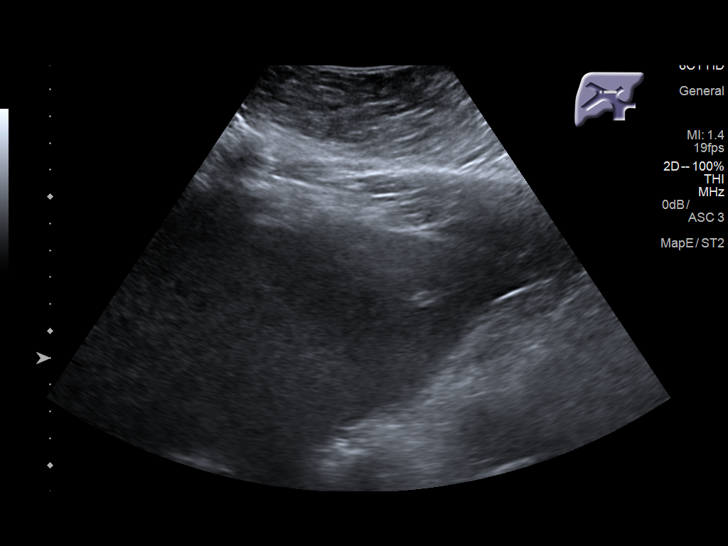
[im 54/117]
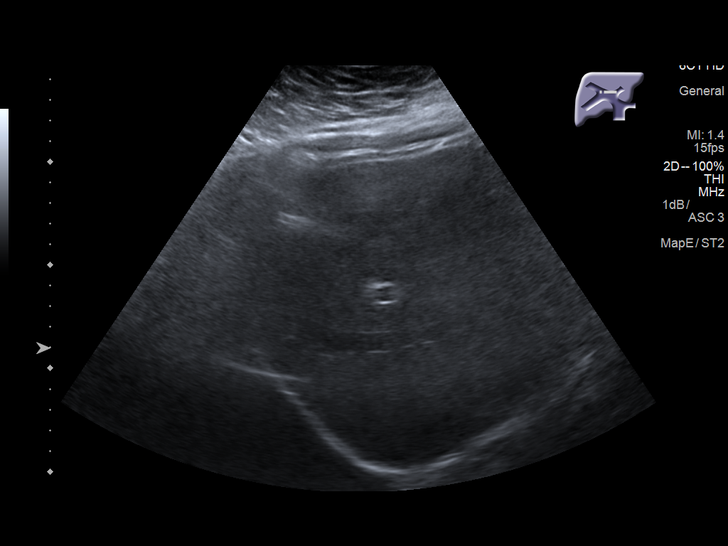
[im 63/117]
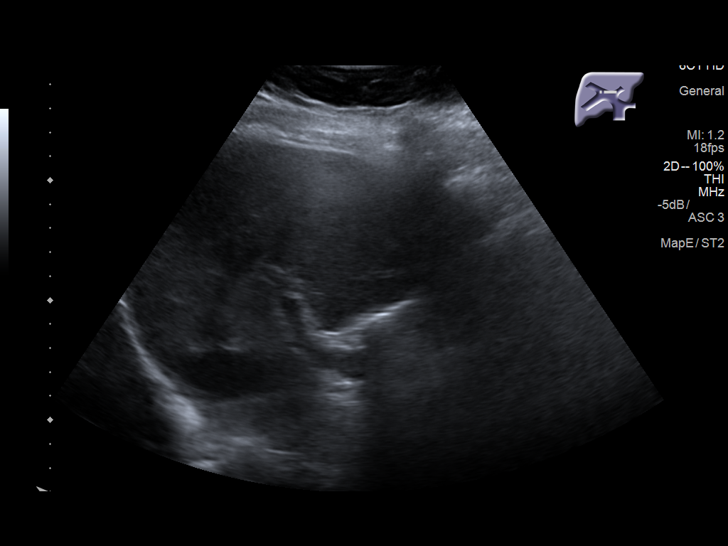
[im 73/117]
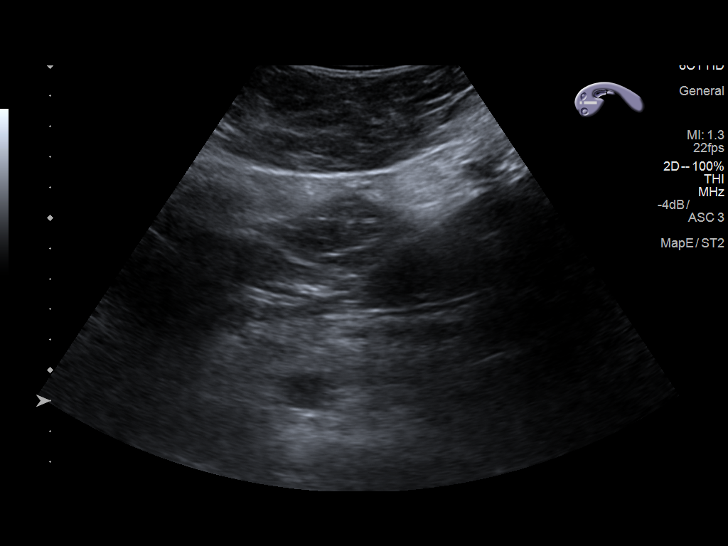
[im 78/117]
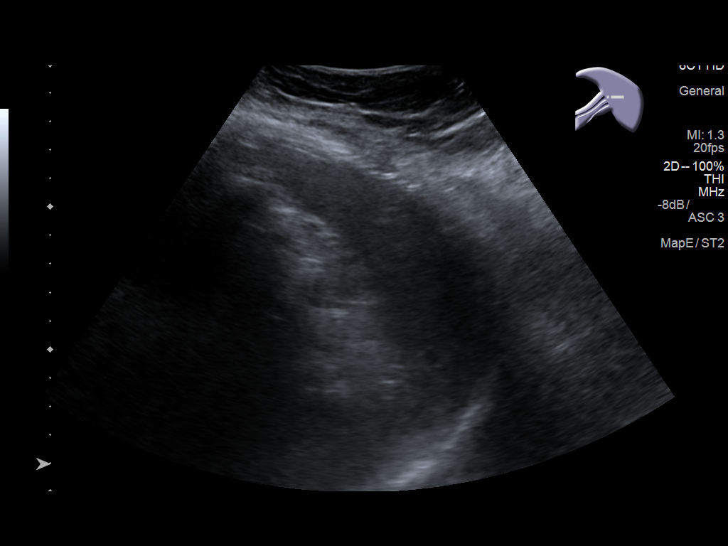
[im 88/117]
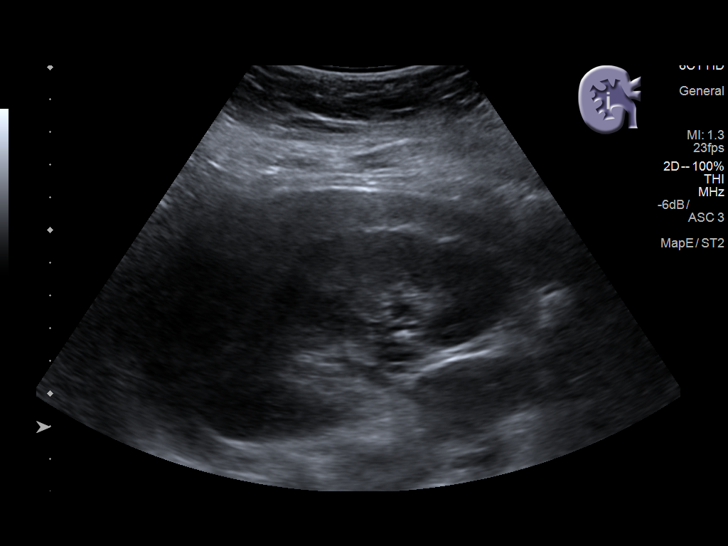
[im 97/117]
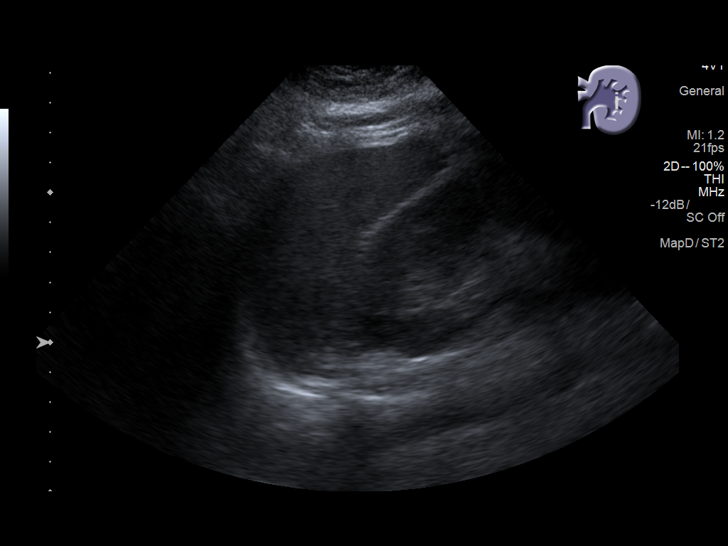
[im 107/117]
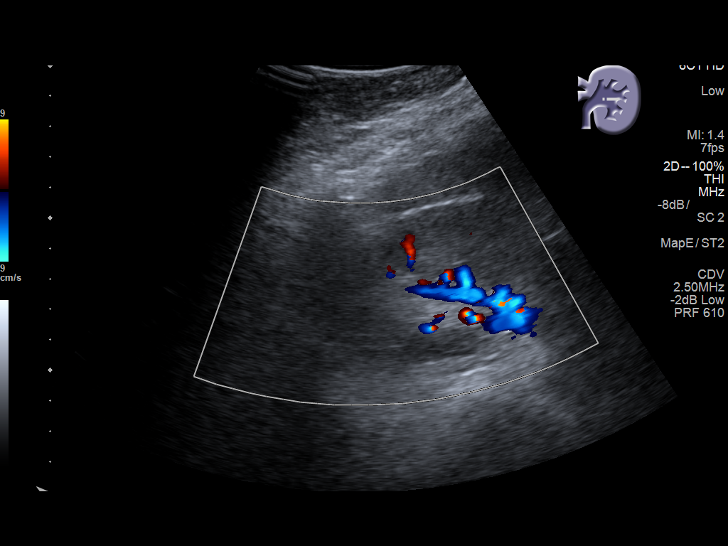
[im 117/117]
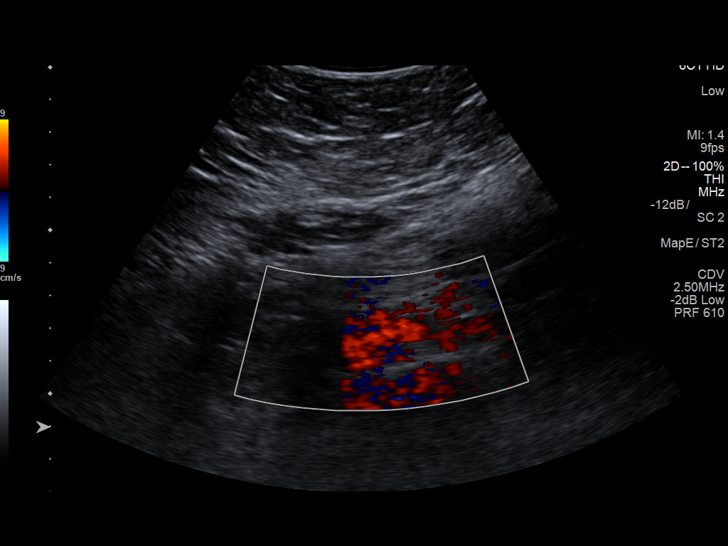

[14 of 25 positions shown; findings below may reference images not displayed]

FINDINGS: Gallbladder: No gallstones or wall thickening visualized. No
sonographic Murphy sign noted by sonographer.

Common bile duct: Diameter: 3.6 mm

Liver: No focal lesion identified. Within normal limits in
parenchymal echogenicity. Portal vein is patent on color Doppler
imaging with normal direction of blood flow towards the liver.

IVC: No abnormality visualized.

Pancreas: Visualized portion unremarkable.

Spleen: Size and appearance within normal limits.

Right Kidney: Length: 11.3 cm. Echogenicity within normal limits. No
mass or hydronephrosis visualized.

Left Kidney: Length: 10.9 cm. Echogenicity within normal limits. No
mass or hydronephrosis visualized.

Abdominal aorta: No aneurysm visualized.

Other findings: None.
IMPRESSION: Normal abdomen ultrasound.  No evidence of cholecystitis.

## 2021-06-22 ENCOUNTER — Inpatient Hospital Stay (HOSPITAL_COMMUNITY)
Admission: AD | Admit: 2021-06-22 | Discharge: 2021-06-22 | Disposition: A | Discharge status: Home / Self Care | Payer: Medicaid Other | Attending: Obstetrics and Gynecology | Admitting: Obstetrics and Gynecology

## 2021-06-22 ENCOUNTER — Other Ambulatory Visit: Payer: Self-pay

## 2021-06-22 ENCOUNTER — Encounter (HOSPITAL_COMMUNITY): Payer: Self-pay | Admitting: Obstetrics and Gynecology

## 2021-06-22 DIAGNOSIS — F1291 Cannabis use, unspecified, in remission: Secondary | ICD-10-CM | POA: Diagnosis not present

## 2021-06-22 DIAGNOSIS — O219 Vomiting of pregnancy, unspecified: Secondary | ICD-10-CM | POA: Diagnosis not present

## 2021-06-22 DIAGNOSIS — Z3A08 8 weeks gestation of pregnancy: Secondary | ICD-10-CM

## 2021-06-22 LAB — URINALYSIS, ROUTINE W REFLEX MICROSCOPIC
Bilirubin Urine: NEGATIVE
Glucose, UA: NEGATIVE mg/dL
Hgb urine dipstick: NEGATIVE
Ketones, ur: NEGATIVE mg/dL
Leukocytes,Ua: NEGATIVE
Nitrite: NEGATIVE
Protein, ur: NEGATIVE mg/dL
Specific Gravity, Urine: 1.023 (ref 1.005–1.030)
pH: 7 (ref 5.0–8.0)

## 2021-06-22 MED ORDER — SODIUM CHLORIDE 0.9 % IV SOLN
25.0000 mg | Freq: Once | INTRAVENOUS | Status: AC
  Administered 2021-06-22: 25 mg via INTRAVENOUS
  Filled 2021-06-22: qty 1

## 2021-06-22 MED ORDER — PROMETHAZINE HCL 25 MG PO TABS: 25.0000 mg | ORAL_TABLET | Freq: Four times a day (QID) | ORAL | 1 refills | Status: DC | PRN

## 2021-06-22 MED ORDER — FAMOTIDINE 20 MG PO TABS: 20.0000 mg | ORAL_TABLET | Freq: Every day | ORAL | 1 refills | Status: DC

## 2021-06-22 MED ORDER — LACTATED RINGERS IV SOLN: INTRAVENOUS | Status: DC

## 2021-06-22 MED ORDER — FAMOTIDINE IN NACL 20-0.9 MG/50ML-% IV SOLN
20.0000 mg | Freq: Once | INTRAVENOUS | Status: AC
  Administered 2021-06-22: 20 mg via INTRAVENOUS
  Filled 2021-06-22: qty 50

## 2021-06-22 NOTE — MAU Provider Note (Signed)
History     CSN: [id number]  Arrival date and time: 06/22/21 1055   Event Date/Time   First Provider Initiated Contact with Patient 06/22/21 1155      Chief Complaint  Patient presents with   Nausea   Emesis   Ms. DAIANA FEDORCHAK is a 31 y.o. [id number] at [redacted]w[redacted]d who presents to MAU for nausea and vomiting that started about 2.5 weeks ago. Patient states she has had hyperemesis with her two previous pregnancies and does not have a NOB visit scheduled March 8th, but did not want to wait until then to receive treatment. Patient reports vomiting about 3-4 times daily. Patient reports she has been eating and drinking yogurt, soup and other light foods along with water and juice. Patient reports she vomits within 5 minutes sometimes of eating and drinking. Patient has tried ginger ale, which did not help. Patient also tried Dramamine and ginger chews, but these have not helped wither. Patient also tried peppermint and ginger tea which did not help. Patient reports in previous pregnancies she was prescribed Promethazine and Zofran, which did help. Patient states she was previously smoking marijuana but has since stopped.  Pt denies VB, vaginal discharge/odor/itching. Pt denies abdominal pain, constipation, diarrhea, or urinary problems. Pt denies fever, chills, fatigue, sweating or changes in appetite. Pt denies SOB or chest pain. Pt denies dizziness, HA, light-headedness, weakness.  Problems this pregnancy include: none. Allergies? NKDA Current medications/supplements? PNV, Unisom PRN, B6 PRN Prenatal care provider? [facility]   OB History     Gravida  4   Para  2   Term  2   Preterm  0   AB  1   Living  2      SAB  1   IAB  0   Ectopic  0   Multiple  0   Live Births  2           Past Medical History:  Diagnosis Date   Asthma    Bipolar disorder ([place])    Marijuana use     Past Surgical History:  Procedure Laterality Date   CESAREAN SECTION   03/09/2012   Procedure: CESAREAN SECTION;  Surgeon: Lahoma Crocker, MD;  Location: [facility];  Service: Obstetrics;  Laterality: N/A;   WISDOM TOOTH EXTRACTION      Family History  Problem Relation Age of Onset   Hypertension Father    Diabetes Father    Hypertension Maternal Uncle    Cancer Maternal Grandmother        lung cancer    Hypertension Maternal Grandfather    Diabetes Maternal Grandfather    Other Neg Hx     Social History   Tobacco Use   Smoking status: Never   Smokeless tobacco: Never  Substance Use Topics   Alcohol use: No   Drug use: No    Allergies: No Known Allergies  Medications Prior to Admission  Medication Sig Dispense Refill Last Dose   norethindrone (MICRONOR,CAMILA,ERRIN) 0.35 MG tablet Take 1 tablet (0.35 mg total) by mouth daily. 1 Package 11    ondansetron (ZOFRAN ODT) 4 MG disintegrating tablet Take 1 tablet (4 mg total) by mouth every 8 (eight) hours as needed for nausea or vomiting. 12 tablet 0    Prenatal Vit-Fe Fumarate-FA (MULTIVITAMIN-PRENATAL) 27-0.8 MG TABS tablet Take 1 tablet by mouth daily at 12 noon.      promethazine (PHENERGAN) 25 MG tablet Take 0.5-1 tablets (12.5-25 mg total) by mouth every 6 (  six) hours as needed. 30 tablet 0     Review of Systems  Constitutional:  Negative for chills, diaphoresis, fatigue and fever.  Eyes:  Negative for visual disturbance.  Respiratory:  Negative for shortness of breath.   Cardiovascular:  Negative for chest pain.  Gastrointestinal:  Positive for nausea and vomiting. Negative for abdominal pain, constipation and diarrhea.  Genitourinary:  Negative for dysuria, flank pain, frequency, pelvic pain, urgency, vaginal bleeding and vaginal discharge.  Neurological:  Negative for dizziness, weakness, light-headedness and headaches.   Physical Exam   Blood pressure 132/76, pulse 92, temperature 98.4 F (36.9 C), resp. rate 12, height 5\' 4"  (1.626 m), weight 112.4 kg, last menstrual period  04/23/2021, SpO2 99 %, unknown if currently breastfeeding.  Patient Vitals for the past 24 hrs:  BP Temp Pulse Resp SpO2 Height Weight  06/22/21 1118 132/76 98.4 F (36.9 C) 92 12 99 % 5\' 4"  (1.626 m) 112.4 kg   Physical Exam Vitals and nursing note reviewed.  Constitutional:      General: She is not in acute distress.    Appearance: Normal appearance. She is not ill-appearing, toxic-appearing or diaphoretic.  HENT:     Head: Normocephalic and atraumatic.  Pulmonary:     Effort: Pulmonary effort is normal.  Neurological:     Mental Status: She is alert and oriented to person, place, and time.  Psychiatric:        Mood and Affect: Mood normal.        Behavior: Behavior normal.        Thought Content: Thought content normal.        Judgment: Judgment normal.   Results for orders placed or performed during the hospital encounter of 06/22/21 (from the past 24 hour(s))  Urinalysis, Routine w reflex microscopic Urine, Clean Catch     Status: Abnormal   Collection Time: 06/22/21 11:28 AM  Result Value Ref Range   Color, Urine AMBER (A) YELLOW   APPearance CLOUDY (A) CLEAR   Specific Gravity, Urine 1.023 1.005 - 1.030   pH 7.0 5.0 - 8.0   Glucose, UA NEGATIVE NEGATIVE mg/dL   Hgb urine dipstick NEGATIVE NEGATIVE   Bilirubin Urine NEGATIVE NEGATIVE   Ketones, ur NEGATIVE NEGATIVE mg/dL   Protein, ur NEGATIVE NEGATIVE mg/dL   Nitrite NEGATIVE NEGATIVE   Leukocytes,Ua NEGATIVE NEGATIVE   MAU Course  Procedures  MDM -N/V in pregnancy -normal VS -weight today 112.4kg -UA: amber/cloudy -labs not drawn d/t grossly normal UA and reports of normal pregnancy N/V -1L NS + 20mg  Pepcid + 25mg  Phenergan given, pt reports NV now resolved -pt able to urinate after fluids -PO challenge successful -pt discharged to home in stable condition  Orders Placed This Encounter  Procedures   Urinalysis, Routine w reflex microscopic Urine, Clean Catch    Standing Status:   Standing    Number of  Occurrences:   1   Insert peripheral IV    Standing Status:   Standing    Number of Occurrences:   1   Meds ordered this encounter  Medications   promethazine (PHENERGAN) 25 mg in sodium chloride 0.9 % 1,000 mL infusion   famotidine (PEPCID) IVPB 20 mg premix   lactated ringers infusion   Assessment and Plan   1. Nausea and vomiting in pregnancy   2. [redacted] weeks gestation of pregnancy    Allergies as of 06/22/2021   No Known Allergies      Medication List     STOP  taking these medications    norethindrone 0.35 MG tablet Commonly known as: MICRONOR   ondansetron 4 MG disintegrating tablet Commonly known as: Zofran ODT       TAKE these medications    famotidine 20 MG tablet Commonly known as: Pepcid Take 1 tablet (20 mg total) by mouth daily.   multivitamin-prenatal 27-0.8 MG Tabs tablet Take 1 tablet by mouth daily at 12 noon.   promethazine 25 MG tablet Commonly known as: PHENERGAN Take 0.5-1 tablets (12.5-25 mg total) by mouth every 6 (six) hours as needed. What changed: Another medication with the same name was added. Make sure you understand how and when to take each.   promethazine 25 MG tablet Commonly known as: PHENERGAN Take 1 tablet (25 mg total) by mouth every 6 (six) hours as needed for nausea or vomiting. What changed: You were already taking a medication with the same name, and this prescription was added. Make sure you understand how and when to take each.       -pt advised not to restart smoking marijuana for N/V or other reason as this can worsen symptoms of N/V, information given on cannabis hyperemesis -safe meds in pregnancy list given -pt advised to take medications around the clock and not to stop taking if feeling better -RX suppositories declined by patient -discussed nonpharmacologic and pharmacologic treatments of N/V -discussed normal expectations for N/V in pregnancy -pt discharged to home in stable condition  Elmyra Ricks E  Donna Snooks 06/22/2021, 12:04 PM

## 2021-06-22 NOTE — MAU Note (Addendum)
Patient arrived to MAU complaining of nausea and vomiting everyday  since she was [redacted]wks pregnant. Patient stated that she has had hyperemesis Gravidarum with both of her last pregnancies, and is sure that she experiencing the same with this pregnancy. Patient denies any other complaint or pain.  Pt stated she vomited 2x today.  She stated that her first prenatal appointment is 07/16/2021, and she would like medications to help with the nausea and vomiting.

## 2021-06-22 NOTE — Discharge Instructions (Signed)

## 2021-07-16 LAB — OB RESULTS CONSOLE HGB/HCT, BLOOD
HCT: 37 (ref 29–41)
Hemoglobin: 12.8

## 2021-07-16 LAB — OB RESULTS CONSOLE GC/CHLAMYDIA
Chlamydia: NEGATIVE
Gonorrhea: NEGATIVE

## 2021-07-16 LAB — OB RESULTS CONSOLE RUBELLA ANTIBODY, IGM: Rubella: IMMUNE

## 2021-07-16 LAB — OB RESULTS CONSOLE RPR: RPR: NONREACTIVE

## 2021-07-16 LAB — OB RESULTS CONSOLE HEPATITIS B SURFACE ANTIGEN: Hepatitis B Surface Ag: NEGATIVE

## 2021-07-16 LAB — OB RESULTS CONSOLE PLATELET COUNT: Platelets: 254

## 2021-07-16 LAB — OB RESULTS CONSOLE HIV ANTIBODY (ROUTINE TESTING): HIV: NONREACTIVE

## 2021-07-22 ENCOUNTER — Other Ambulatory Visit: Payer: Self-pay

## 2021-08-12 ENCOUNTER — Other Ambulatory Visit: Payer: Self-pay | Admitting: Obstetrics

## 2021-08-12 DIAGNOSIS — Z363 Encounter for antenatal screening for malformations: Secondary | ICD-10-CM

## 2021-09-04 ENCOUNTER — Ambulatory Visit: Payer: Medicaid Other | Attending: Obstetrics

## 2021-09-04 ENCOUNTER — Ambulatory Visit: Payer: Medicaid Other

## 2021-09-08 ENCOUNTER — Ambulatory Visit (INDEPENDENT_AMBULATORY_CARE_PROVIDER_SITE_OTHER): Payer: Medicaid Other | Admitting: Obstetrics and Gynecology

## 2021-09-08 ENCOUNTER — Ambulatory Visit (INDEPENDENT_AMBULATORY_CARE_PROVIDER_SITE_OTHER): Payer: Medicaid Other | Admitting: Licensed Clinical Social Worker

## 2021-09-08 ENCOUNTER — Encounter: Payer: Self-pay | Admitting: Obstetrics and Gynecology

## 2021-09-08 ENCOUNTER — Telehealth: Payer: Self-pay | Admitting: *Deleted

## 2021-09-08 VITALS — BP 125/73 | HR 112 | Wt 279.5 lb

## 2021-09-08 DIAGNOSIS — O0990 Supervision of high risk pregnancy, unspecified, unspecified trimester: Secondary | ICD-10-CM | POA: Insufficient documentation

## 2021-09-08 DIAGNOSIS — Z331 Pregnant state, incidental: Secondary | ICD-10-CM

## 2021-09-08 DIAGNOSIS — F319 Bipolar disorder, unspecified: Secondary | ICD-10-CM | POA: Diagnosis not present

## 2021-09-08 DIAGNOSIS — F4389 Other reactions to severe stress: Secondary | ICD-10-CM

## 2021-09-08 DIAGNOSIS — Z3A19 19 weeks gestation of pregnancy: Secondary | ICD-10-CM

## 2021-09-08 DIAGNOSIS — Z8659 Personal history of other mental and behavioral disorders: Secondary | ICD-10-CM | POA: Diagnosis not present

## 2021-09-08 DIAGNOSIS — Z3480 Encounter for supervision of other normal pregnancy, unspecified trimester: Secondary | ICD-10-CM

## 2021-09-08 DIAGNOSIS — O0930 Supervision of pregnancy with insufficient antenatal care, unspecified trimester: Secondary | ICD-10-CM | POA: Diagnosis not present

## 2021-09-08 DIAGNOSIS — Z98891 History of uterine scar from previous surgery: Secondary | ICD-10-CM

## 2021-09-08 MED ORDER — ASPIRIN EC 81 MG PO TBEC: 81.0000 mg | DELAYED_RELEASE_TABLET | Freq: Every day | ORAL | 2 refills | Status: DC

## 2021-09-08 NOTE — Telephone Encounter (Signed)
Pt called to office, had questions about low dose ASA. ?Discussed reasons for use in pregnancy. ?Pt had no other questions.  ?

## 2021-09-08 NOTE — Progress Notes (Signed)
?  Subjective:  ? ? Jean Dougherty is a [id number] [redacted]w[redacted]d being seen today for her first obstetrical visit.  Her obstetrical history is significant for advanced maternal age and previous cesarean section followed by VBAC . Patient does intend to breast feed. Pregnancy history fully reviewed. ? ?Patient reports no complaints. ? ?Vitals:  ? 09/08/21 0950  ?BP: 125/73  ?Pulse: (!) 112  ?Weight: 279 lb 7.5 oz (126.8 kg)  ? ? ?HISTORY: ?OB History  ?Gravida Para Term Preterm AB Living  ?4 2 2  0 1 2  ?SAB IAB Ectopic Multiple Live Births  ?1 0 0 0 2  ?  ?# Outcome Date GA Lbr Len/2nd Weight Sex Delivery Anes PTL Lv  ?4 Current           ?3 Term 09/15/15 [redacted]w[redacted]d 23:40 / 01:17 7 lb 11.3 oz (3.495 kg) M VBAC EPI  LIV  ?2 Term 03/09/12 109w2d  7 lb 6.9 oz (3.37 kg) F CS-LTranv EPI  LIV  ?1 SAB           ? ?Past Medical History:  ?Diagnosis Date  ? Asthma   ? Bipolar disorder (HCC)   ? Marijuana use   ? ?Past Surgical History:  ?Procedure Laterality Date  ? CESAREAN SECTION  03/09/2012  ? Procedure: CESAREAN SECTION;  Surgeon: 03/11/2012, MD;  Location: [facility];  Service: Obstetrics;  Laterality: N/A;  ? WISDOM TOOTH EXTRACTION    ? ?Family History  ?Problem Relation Age of Onset  ? Hypertension Father   ? Diabetes Father   ? Hypertension Maternal Uncle   ? Cancer Maternal Grandmother   ?     lung cancer   ? Hypertension Maternal Grandfather   ? Diabetes Maternal Grandfather   ? Other Neg Hx   ? ? ? ?Exam  ? ? ?Uterus:   20-weeks  ? ? ?  ?Assessment:  ? ? Pregnancy: Jean Dougherty ?Patient Active Problem List  ? Diagnosis Date Noted  ? Supervision of other normal pregnancy, antepartum 09/08/2021  ? Supervision of high risk pregnancy, antepartum 02/11/2018  ? Failed attempted termination of pregnancy without complication 02/11/2018  ? Healthcare maintenance 05/19/2016  ? Bipolar disorder (HCC) 04/27/2016  ? Seasonal allergies 04/27/2016  ? Impaired glucose in pregnancy, antepartum 09/15/2015  ? History of inadequate prenatal care  09/15/2015  ? Marijuana use 09/14/2015  ? Obesity 09/14/2015  ? H/O: C-section 09/14/2015  ? ?  ? ?  ?Plan:  ? ?  ?Initial labs drawn. ?Prenatal vitamins. ?Problem list reviewed and updated. ?Genetic Screening discussed : panorama results reviewed. AFP today ? Ultrasound discussed; fetal survey: ordered. ?A1C ordered ?Rx ASA provided ? Follow up in 4 weeks. ?50% of 30 min visit spent on counseling and coordination of care.  ?  ? ?Sharis Keeran ?09/08/2021 ? ? ?

## 2021-09-08 NOTE — Patient Instructions (Signed)
Second Trimester of Pregnancy ? ?The second trimester of pregnancy is from week 13 through week 27. This is months 4 through 6 of pregnancy. The second trimester is often a time when you feel your best. Your body has adjusted to being pregnant, and you begin to feel better physically. ?During the second trimester: ?Morning sickness has lessened or stopped completely. ?You may have more energy. ?You may have an increase in appetite. ?The second trimester is also a time when the unborn baby (fetus) is growing rapidly. At the end of the sixth month, the fetus may be up to 12 inches long and weigh about 1? pounds. You will likely begin to feel the baby move (quickening) between 16 and 20 weeks of pregnancy. ?Body changes during your second trimester ?Your body continues to go through many changes during your second trimester. The changes vary and generally return to normal after the baby is born. ?Physical changes ?Your weight will continue to increase. You will notice your lower abdomen bulging out. ?You may begin to get stretch marks on your hips, abdomen, and breasts. ?Your breasts will continue to grow and to become tender. ?Dark spots or blotches (chloasma or mask of pregnancy) may develop on your face. ?A dark line from your belly button to the pubic area (linea nigra) may appear. ?You may have changes in your hair. These can include thickening of your hair, rapid growth, and changes in texture. Some people also have hair loss during or after pregnancy, or hair that feels dry or thin. ?Health changes ?You may develop headaches. ?You may have heartburn. ?You may develop constipation. ?You may develop hemorrhoids or swollen, bulging veins (varicose veins). ?Your gums may bleed and may be sensitive to brushing and flossing. ?You may urinate more often because the fetus is pressing on your bladder. ?You may have back pain. This is caused by: ?Weight gain. ?Pregnancy hormones that are relaxing the joints in your  pelvis. ?A shift in weight and the muscles that support your balance. ?Follow these instructions at home: ?Medicines ?Follow your health care provider's instructions regarding medicine use. Specific medicines may be either safe or unsafe to take during pregnancy. Do not take any medicines unless approved by your health care provider. ?Take a prenatal vitamin that contains at least 600 micrograms (mcg) of folic acid. ?Eating and drinking ?Eat a healthy diet that includes fresh fruits and vegetables, whole grains, good sources of protein such as meat, eggs, or tofu, and low-fat dairy products. ?Avoid raw meat and unpasteurized juice, milk, and cheese. These carry germs that can harm you and your baby. ?You may need to take these actions to prevent or treat constipation: ?Drink enough fluid to keep your urine pale yellow. ?Eat foods that are high in fiber, such as beans, whole grains, and fresh fruits and vegetables. ?Limit foods that are high in fat and processed sugars, such as fried or sweet foods. ?Activity ?Exercise only as directed by your health care provider. Most people can continue their usual exercise routine during pregnancy. Try to exercise for 30 minutes at least 5 days a week. Stop exercising if you develop contractions in your uterus. ?Stop exercising if you develop pain or cramping in the lower abdomen or lower back. ?Avoid exercising if it is very hot or humid or if you are at a high altitude. ?Avoid heavy lifting. ?If you choose to, you may have sex unless your health care provider tells you not to. ?Relieving pain and discomfort ?Wear a supportive   bra to prevent discomfort from breast tenderness. ?Take warm sitz baths to soothe any pain or discomfort caused by hemorrhoids. Use hemorrhoid cream if your health care provider approves. ?Rest with your legs raised (elevated) if you have leg cramps or low back pain. ?If you develop varicose veins: ?Wear support hose as told by your health care  provider. ?Elevate your feet for 15 minutes, 3-4 times a day. ?Limit salt in your diet. ?Safety ?Wear your seat belt at all times when driving or riding in a car. ?Talk with your health care provider if someone is verbally or physically abusive to you. ?Lifestyle ?Do not use hot tubs, steam rooms, or saunas. ?Do not douche. Do not use tampons or scented sanitary pads. ?Avoid cat litter boxes and soil used by cats. These carry germs that can cause birth defects in the baby and possibly loss of the fetus by miscarriage or stillbirth. ?Do not use herbal remedies, alcohol, illegal drugs, or medicines that are not approved by your health care provider. Chemicals in these products can harm your baby. ?Do not use any products that contain nicotine or tobacco, such as cigarettes, e-cigarettes, and chewing tobacco. If you need help quitting, ask your health care provider. ?General instructions ?During a routine prenatal visit, your health care provider will do a physical exam and other tests. He or she will also discuss your overall health. Keep all follow-up visits. This is important. ?Ask your health care provider for a referral to a local prenatal education class. ?Ask for help if you have counseling or nutritional needs during pregnancy. Your health care provider can offer advice or refer you to specialists for help with various needs. ?Where to find more information ?American Pregnancy Association: americanpregnancy.org ?[employer]: https://www.todd-brady.net/ ?Office on Women's Health: MightyReward.co.nz ?Contact a health care provider if you have: ?A headache that does not go away when you take medicine. ?Vision changes or you see spots in front of your eyes. ?Mild pelvic cramps, pelvic pressure, or nagging pain in the abdominal area. ?Persistent nausea, vomiting, or diarrhea. ?A bad-smelling vaginal discharge or foul-smelling urine. ?Pain when you  urinate. ?Sudden or extreme swelling of your face, hands, ankles, feet, or legs. ?A fever. ?Get help right away if you: ?Have fluid leaking from your vagina. ?Have spotting or bleeding from your vagina. ?Have severe abdominal cramping or pain. ?Have difficulty breathing. ?Have chest pain. ?Have fainting spells. ?Have not felt your baby move for the time period told by your health care provider. ?Have new or increased pain, swelling, or redness in an arm or leg. ?Summary ?The second trimester of pregnancy is from week 13 through week 27 (months 4 through 6). ?Do not use herbal remedies, alcohol, illegal drugs, or medicines that are not approved by your health care provider. Chemicals in these products can harm your baby. ?Exercise only as directed by your health care provider. Most people can continue their usual exercise routine during pregnancy. ?Keep all follow-up visits. This is important. ?This information is not intended to replace advice given to you by your health care provider. Make sure you discuss any questions you have with your health care provider. ?Document Revised: 10/04/2019 Document Reviewed: 08/10/2019 ?Elsevier Patient Education ? 2023 Elsevier Inc. ? ?Preparing for Vaginal Birth After Cesarean Delivery ?Vaginal birth after cesarean delivery (VBAC) means giving birth vaginally after previously delivering a baby through a cesarean section (C-section). You and your health care provider will discuss your options and whether you may be  a good candidate for VBAC. ?Types of options ?After a cesarean birth, your options for future deliveries may include: ?A scheduled cesarean birth. This is done in a hospital with an operating room. ?A trial of labor after cesarean. A successful trial of labor results in a vaginal delivery. If it is not successful, you will need to have a cesarean birth. A trial of labor after a cesarean should be attempted in facilities where an emergency cesarean birth can be  performed. ?What are the benefits? ?The benefits of delivering your baby vaginally instead of by a cesarean birth include: ?A shorter hospital stay. ?A faster recovery time. ?Less pain. ?Avoiding risks associated with major surgery, such

## 2021-09-09 ENCOUNTER — Telehealth: Payer: Self-pay | Admitting: *Deleted

## 2021-09-09 LAB — HEMOGLOBIN A1C
Est. average glucose Bld gHb Est-mCnc: 100 mg/dL
Hgb A1c MFr Bld: 5.1 % (ref 4.8–5.6)

## 2021-09-09 NOTE — Telephone Encounter (Signed)
Spoke with pt regarding pain she has been having in her left hand. ?Pt describes as her index and middle finger will be numb.  Advised may be carpal tunnel symptoms in pregnancy. Recommended Tylenol and wrist splint/brace to help with symptoms. Advised to call office if problems gets worse, otherwise discuss with provider at next appt.  ?

## 2021-09-10 LAB — AFP, SERUM, OPEN SPINA BIFIDA
AFP MoM: 1
AFP Value: 41.8 ng/mL
Gest. Age on Collection Date: 19.5 weeks
Maternal Age At EDD: 30.7 yr
OSBR Risk 1 IN: 10000
Test Results:: NEGATIVE
Weight: 279 [lb_av]

## 2021-09-10 NOTE — BH Specialist Note (Signed)
Integrated Behavioral Health via Telemedicine Visit ? ?09/10/2021 ?Jean Dougherty ?[id number] ? ?Number of Integrated Behavioral Health Clinician visits:  ?Session Start time:  950am ?Session End time: 1020am ?Total time in minutes: 30 mins via phone  ? ?Referring Provider: Chana Bode MD ?Patient/Family location: Home  ?[facility] Provider location: [facility]  ?All persons participating in visit: Pt S Reis and Jean Dougherty  ?Types of Service: Individual psychotherapy and Telephone visit ? ?I connected with Jean Dougherty and/or Jean Dougherty's n/a via  Telephone or Video Enabled Telemedicine Application  (Video is Caregility application) and verified that I am speaking with the correct person using two identifiers. Discussed confidentiality: Yes  ? ?I discussed the limitations of telemedicine and the availability of in person appointments.  Discussed there is a possibility of technology failure and discussed alternative modes of communication if that failure occurs. ? ?I discussed that engaging in this telemedicine visit, they consent to the provision of behavioral healthcare and the services will be billed under their insurance. ? ?Patient and/or legal guardian expressed understanding and consented to Telemedicine visit: Yes  ? ?Presenting Concerns: ?Patient and/or family reports the following symptoms/concerns: history of bipolar disorder ?Duration of problem: over one year ; Severity of problem: mild ? ?Patient and/or Family's Strengths/Protective Factors: ?Concrete supports in place (healthy food, safe environments, etc.) ? ?Goals Addressed: ?Patient will: ? Reduce symptoms of:  stress and history of bipolar disorder   ? Increase knowledge and/or ability of: coping skills and stress reduction  ? Demonstrate ability to: Increase healthy adjustment to current life circumstances ? ?Progress towards Goals: ?Ongoing ? ?Interventions: ?Interventions utilized:  Motivational Interviewing ?Standardized Assessments  completed: PHQ 9 ? ?Patient and/or Family Response: Ms. Blasco responded well to visit ? ?Assessment: ?Patient has a history of bipolar disorder. Ms. Corriher reports increase stress with pregnancy. Ms. Shontz reports numbness with left arm and hand. Information was reported to triage nurse and phone call to patient.   ? ?Patient may benefit from integrated behavioral health. ? ?Plan: ?Follow up with behavioral health clinician on : prn ?Behavioral recommendations: prioritize rest, communicate needs with supportive partner for added support. Keep medical appts  ?Referral(s): Integrated [employer] (In Clinic) ? ?I discussed the assessment and treatment plan with the patient and/or parent/guardian. They were provided an opportunity to ask questions and all were answered. They agreed with the plan and demonstrated an understanding of the instructions. ?  ?They were advised to call back or seek an in-person evaluation if the symptoms worsen or if the condition fails to improve as anticipated. ? ?Gwyndolyn Saxon, Jean ?

## 2021-09-11 ENCOUNTER — Telehealth: Payer: Self-pay

## 2021-09-11 ENCOUNTER — Other Ambulatory Visit: Payer: Self-pay | Admitting: *Deleted

## 2021-09-11 DIAGNOSIS — Z3480 Encounter for supervision of other normal pregnancy, unspecified trimester: Secondary | ICD-10-CM

## 2021-09-11 NOTE — Progress Notes (Signed)
U/s order changed to detail per MFM request.  ?

## 2021-09-15 ENCOUNTER — Ambulatory Visit: Payer: Medicaid Other | Admitting: *Deleted

## 2021-09-15 ENCOUNTER — Other Ambulatory Visit: Payer: Self-pay | Admitting: *Deleted

## 2021-09-15 ENCOUNTER — Ambulatory Visit (HOSPITAL_BASED_OUTPATIENT_CLINIC_OR_DEPARTMENT_OTHER): Payer: Medicaid Other | Admitting: Obstetrics

## 2021-09-15 ENCOUNTER — Encounter: Payer: Self-pay | Admitting: *Deleted

## 2021-09-15 ENCOUNTER — Ambulatory Visit: Payer: Medicaid Other | Attending: Obstetrics

## 2021-09-15 VITALS — BP 110/66 | HR 95

## 2021-09-15 DIAGNOSIS — Z3A20 20 weeks gestation of pregnancy: Secondary | ICD-10-CM

## 2021-09-15 DIAGNOSIS — Z6841 Body Mass Index (BMI) 40.0 and over, adult: Secondary | ICD-10-CM

## 2021-09-15 DIAGNOSIS — E669 Obesity, unspecified: Secondary | ICD-10-CM

## 2021-09-15 DIAGNOSIS — Z3480 Encounter for supervision of other normal pregnancy, unspecified trimester: Secondary | ICD-10-CM | POA: Insufficient documentation

## 2021-09-15 DIAGNOSIS — O99212 Obesity complicating pregnancy, second trimester: Secondary | ICD-10-CM

## 2021-09-15 DIAGNOSIS — Z363 Encounter for antenatal screening for malformations: Secondary | ICD-10-CM | POA: Diagnosis not present

## 2021-09-15 DIAGNOSIS — O34219 Maternal care for unspecified type scar from previous cesarean delivery: Secondary | ICD-10-CM | POA: Diagnosis not present

## 2021-09-15 NOTE — Progress Notes (Signed)
MFM Note ? ?Jean Dougherty was seen for a detailed fetal anatomy scan due to maternal obesity with a BMI of 46.3. ? ?She denies any significant past medical history and denies any problems in her current pregnancy.   ? ?She had a cell free DNA test earlier in her pregnancy which indicated a low risk for trisomy 69, 51, and 13. A female fetus is predicted. ? ?She was informed that the fetal growth and amniotic fluid level were appropriate for her gestational age.  ? ?There were no obvious fetal anomalies noted on today's ultrasound exam.  However, today's exam was limited due to extreme maternal body habitus. ? ?The patient was informed that anomalies may be missed due to technical limitations. If the fetus is in a suboptimal position or maternal habitus is increased, visualization of the fetus in the maternal uterus may be impaired. ? ?The following were discussed during today's consultation: ? ?Obesity in pregnancy  ? ?The recommended total weight gain in pregnancy for obese women's between 10 to 20 pounds. ? ?The patient reports that her early screen for gestational diabetes was negative.  She should have a repeat diabetes screen at between 24 to 28 weeks. ? ?Due to maternal obesity, we will continue to follow her with monthly growth ultrasounds.   ? ?Due to the increased risk of stillbirth in women with a BMI of greater than 40, we will start weekly fetal testing at 34 weeks. ? ?As maternal obesity may present challenges associated with the management of anesthesia, an anesthesia consult should be obtained when she is admitted in labor. ? ?As African-American women with a BMI of greater than 30 may be at increased risk for developing preeclampsia, she was advised to continue taking a daily baby aspirin (81 mg daily) for preeclampsia prophylaxis.   ? ?The patient stated that all of her questions were answered today.   ? ?A follow-up growth scan was scheduled in 4 weeks.   ? ?A total of 30 minutes was spent counseling  and coordinating the care for this patient.  Greater than 50% of the time was spent in direct face-to-face contact.  ?

## 2021-10-05 ENCOUNTER — Emergency Department (HOSPITAL_COMMUNITY)
Admission: EM | Admit: 2021-10-05 | Discharge: 2021-10-05 | Disposition: A | Discharge status: Home / Self Care | Payer: Medicaid Other | Attending: Emergency Medicine | Admitting: Emergency Medicine

## 2021-10-05 ENCOUNTER — Other Ambulatory Visit: Payer: Self-pay

## 2021-10-05 ENCOUNTER — Encounter (HOSPITAL_COMMUNITY): Payer: Self-pay | Admitting: Emergency Medicine

## 2021-10-05 DIAGNOSIS — J028 Acute pharyngitis due to other specified organisms: Secondary | ICD-10-CM | POA: Diagnosis not present

## 2021-10-05 DIAGNOSIS — J029 Acute pharyngitis, unspecified: Secondary | ICD-10-CM

## 2021-10-05 DIAGNOSIS — Z20822 Contact with and (suspected) exposure to covid-19: Secondary | ICD-10-CM | POA: Insufficient documentation

## 2021-10-05 DIAGNOSIS — Z7982 Long term (current) use of aspirin: Secondary | ICD-10-CM | POA: Diagnosis not present

## 2021-10-05 DIAGNOSIS — J45909 Unspecified asthma, uncomplicated: Secondary | ICD-10-CM | POA: Diagnosis not present

## 2021-10-05 DIAGNOSIS — B9789 Other viral agents as the cause of diseases classified elsewhere: Secondary | ICD-10-CM | POA: Insufficient documentation

## 2021-10-05 LAB — GROUP A STREP BY PCR: Group A Strep by PCR: NOT DETECTED

## 2021-10-05 LAB — RESP PANEL BY RT-PCR (FLU A&B, COVID) ARPGX2
Influenza A by PCR: NEGATIVE
Influenza B by PCR: NEGATIVE
SARS Coronavirus 2 by RT PCR: NEGATIVE

## 2021-10-05 NOTE — ED Provider Notes (Signed)
[facility] EMERGENCY DEPARTMENT Provider Note   CSN: [id number] Arrival date & time: 10/05/21  [id number]     History  Chief Complaint  Patient presents with   Sore Throat    DENEA Jean Dougherty is a 31 y.o. female.  31 year old female presents at 59 months gestation with complaint of sore throat x1 week.  Throat is described as scratchy and burning in sensation with associated postnasal drip.  Not improving with Benadryl and TheraFlu.  Patient reports healthy pregnancy with routine prenatal care without pregnancy related complaint today.  Denies fevers, chills, ear pain, cough or sneezing.  No other complaints or concerns. Past medical history of asthma and bipolar disorder.      Home Medications Prior to Admission medications   Medication Sig Start Date End Date Taking? Authorizing Provider  aspirin EC 81 MG tablet Take 1 tablet (81 mg total) by mouth daily. Take after 12 weeks for prevention of preeclampsia later in pregnancy 09/08/21   Constant, Peggy, MD  famotidine (PEPCID) 20 MG tablet Take 1 tablet (20 mg total) by mouth daily. 06/22/21 06/22/22  Nugent, Gerrie Nordmann, NP  Prenatal Vit-Fe Fumarate-FA (MULTIVITAMIN-PRENATAL) 27-0.8 MG TABS tablet Take 1 tablet by mouth daily at 12 noon.    [provider]  promethazine (PHENERGAN) 25 MG tablet Take 0.5-1 tablets (12.5-25 mg total) by mouth every 6 (six) hours as needed. 02/10/18   Tresea Mall, CNM  promethazine (PHENERGAN) 25 MG tablet Take 1 tablet (25 mg total) by mouth every 6 (six) hours as needed for nausea or vomiting. 06/22/21 07/22/21  Nugent, Gerrie Nordmann, NP      Allergies    Patient has no known allergies.    Review of Systems   Review of Systems Negative except as per HPI Physical Exam Updated Vital Signs BP (!) 117/57 (BP Location: Left Arm)   Pulse (!) 103   Temp 98.3 F (36.8 C) (Oral)   Resp 16   LMP 04/23/2021   SpO2 100%  Physical Exam Vitals and nursing note reviewed.  Constitutional:       General: She is not in acute distress.    Appearance: She is well-developed. She is not diaphoretic.  HENT:     Head: Normocephalic and atraumatic.     Right Ear: Tympanic membrane and ear canal normal.     Left Ear: Tympanic membrane and ear canal normal.     Mouth/Throat:     Mouth: Mucous membranes are moist. No oral lesions.     Pharynx: Uvula midline. Posterior oropharyngeal erythema present. No pharyngeal swelling, oropharyngeal exudate or uvula swelling.     Tonsils: No tonsillar exudate or tonsillar abscesses. 1+ on the right. 1+ on the left.  Eyes:     Pupils: Pupils are equal, round, and reactive to light.     Comments: Small subconjunctival hemorrhage noted to right eye temporal aspect.    Pulmonary:     Effort: Pulmonary effort is normal.  Skin:    General: Skin is warm and dry.     Findings: No erythema or rash.  Neurological:     Mental Status: She is alert and oriented to person, place, and time.  Psychiatric:        Behavior: Behavior normal.    ED Results / Procedures / Treatments   Labs (all labs ordered are listed, but only abnormal results are displayed) Labs Reviewed  GROUP A STREP BY PCR  RESP PANEL BY RT-PCR (FLU A&B, COVID) [id number]  EKG None  Radiology No results found.  Procedures Procedures    Medications Ordered in ED Medications - No data to display  ED Course/ Medical Decision Making/ A&P                           Medical Decision Making  This patient presents to the ED for concern of sore throat, this involves an extensive number of treatment options, and is a complaint that carries with it a high risk of complications and morbidity.  The differential diagnosis includes but not limited to postnasal drip, allergies, pharyngitis, tonsillitis, strep, COVID, flu   Co morbidities that complicate the patient evaluation  Asthma, bipolar, pregnant   Additional history obtained:  External records from outside source obtained and  reviewed including most recent OB visit on 09/15/21 for growth monitoring    Lab Tests:  I Ordered, and personally interpreted labs.  The pertinent results include: Strep, COVID, flu with all results negative.  Problem List / ED Course / Critical interventions / Medication management  31 year old female with complaint of sore throat x1 week.  On exam is found to have mild erythema to tonsils and oropharynx without exudate. Uvula is midline, no evidence of peritonsillar abscess, no tender cervical of adenopathy.  Suspect secondary to postnasal drip.  COVID and flu test collected and are negative.  Recommend Zyrtec and Flonase, warm salt water gargles, drink warm tea with honey and recheck with her OB. I have reviewed the patients home medicines and have made adjustments as needed   Social Determinants of Health:  Has prenatal care   Test / Admission - Considered:  Consider mono test if symptoms do not improve but at this time, no Lns, no reports of fatigue or abdominal pain         Final Clinical Impression(s) / ED Diagnoses Final diagnoses:  Viral pharyngitis    Rx / DC Orders ED Discharge Orders     None         Tacy Learn, PA-C 10/05/21 1148    Noemi Chapel, MD 10/07/21 [phone]

## 2021-10-05 NOTE — Discharge Instructions (Signed)
Your strep, COVID, flu tests are all negative.  Be sure to drink plenty of water.  You can take Zyrtec daily and use Flonase.  You can gargle warm salt water and drink warm tea with honey.  Recheck with your OB.

## 2021-10-05 NOTE — ED Notes (Signed)
Spoke to charge RN at [facility] that discussed pt with provider and said they need to stay in ED for evaluation.

## 2021-10-05 NOTE — ED Triage Notes (Signed)
Pt is 5 months pregnant.  C/o sore throat x 1 week.

## 2021-10-13 ENCOUNTER — Encounter: Payer: Medicaid Other | Admitting: Obstetrics and Gynecology

## 2021-10-13 ENCOUNTER — Ambulatory Visit: Payer: Medicaid Other | Admitting: Licensed Clinical Social Worker

## 2021-10-15 ENCOUNTER — Other Ambulatory Visit: Payer: Self-pay | Admitting: *Deleted

## 2021-10-15 ENCOUNTER — Ambulatory Visit: Payer: Medicaid Other | Attending: Obstetrics

## 2021-10-15 ENCOUNTER — Ambulatory Visit (INDEPENDENT_AMBULATORY_CARE_PROVIDER_SITE_OTHER): Payer: Medicaid Other | Admitting: Licensed Clinical Social Worker

## 2021-10-15 ENCOUNTER — Ambulatory Visit: Payer: Medicaid Other | Admitting: *Deleted

## 2021-10-15 ENCOUNTER — Encounter: Payer: Self-pay | Admitting: *Deleted

## 2021-10-15 VITALS — BP 126/41 | HR 99

## 2021-10-15 DIAGNOSIS — Z362 Encounter for other antenatal screening follow-up: Secondary | ICD-10-CM

## 2021-10-15 DIAGNOSIS — O99212 Obesity complicating pregnancy, second trimester: Secondary | ICD-10-CM

## 2021-10-15 DIAGNOSIS — E669 Obesity, unspecified: Secondary | ICD-10-CM | POA: Diagnosis not present

## 2021-10-15 DIAGNOSIS — O34219 Maternal care for unspecified type scar from previous cesarean delivery: Secondary | ICD-10-CM

## 2021-10-15 DIAGNOSIS — Z6841 Body Mass Index (BMI) 40.0 and over, adult: Secondary | ICD-10-CM | POA: Insufficient documentation

## 2021-10-15 DIAGNOSIS — Z8659 Personal history of other mental and behavioral disorders: Secondary | ICD-10-CM

## 2021-10-15 DIAGNOSIS — Z3A25 25 weeks gestation of pregnancy: Secondary | ICD-10-CM | POA: Diagnosis not present

## 2021-10-15 NOTE — BH Specialist Note (Signed)
Integrated Behavioral Health via Telemedicine Visit  10/15/2021 NAYRA COURY [id number]  Number of Integrated Behavioral Health Clinician visits: 2 Session Start time: 1103am  Session End time: 1131am Total time in minutes: 27 mins via phone per pt request  Referring Provider: Mercy Riding CMA Patient/Family location: home [facility] Provider location: [place]  All persons participating in visit: Pt S Whitmill and LCSW A. Zade Falkner  Types of Service: Individual psychotherapy and Telephone visit  I connected with Khalilah T Perris and/or Camira T Rice's n/a via  Telephone or Video Enabled Telemedicine Application  (Video is Caregility application) and verified that I am speaking with the correct person using two identifiers. Discussed confidentiality: Yes   I discussed the limitations of telemedicine and the availability of in person appointments.  Discussed there is a possibility of technology failure and discussed alternative modes of communication if that failure occurs.  I discussed that engaging in this telemedicine visit, they consent to the provision of behavioral healthcare and the services will be billed under their insurance.  Patient and/or legal guardian expressed understanding and consented to Telemedicine visit: Yes   Presenting Concerns: Patient and/or family reports the following symptoms/concerns: history of bipolar disorder  Duration of problem: over one year ; Severity of problem: mild  Patient and/or Family's Strengths/Protective Factors: Concrete supports in place (healthy food, safe environments, etc.)  Goals Addressed: Patient will:  Reduce symptoms of: mood instability   Increase knowledge and/or ability of: coping skills   Demonstrate ability to: Increase healthy adjustment to current life circumstances  Progress towards Goals: Ongoing  Interventions: Interventions utilized:  Supportive Counseling Standardized Assessments completed: Not Needed  Patient and/or  Family Response: Ms. Tang reports positive mood improvement since last visit. Ms. Dolphus Jenny reports sleep has improved continues to take prenatal vitamins   Assessment: Patient has a history of bipolar disorder  Patient may benefit from integrated behavioral health.  Plan: Follow up with behavioral health clinician on : 3-4 weeks  Behavioral recommendations: prioritize rest, communicate needs with supportive partner for added support. Keep medical appts  Referral(s): Integrated [employer] (In Clinic)  I discussed the assessment and treatment plan with the patient and/or parent/guardian. They were provided an opportunity to ask questions and all were answered. They agreed with the plan and demonstrated an understanding of the instructions.   They were advised to call back or seek an in-person evaluation if the symptoms worsen or if the condition fails to improve as anticipated.  Gwyndolyn Saxon, LCSW

## 2021-10-22 ENCOUNTER — Ambulatory Visit (INDEPENDENT_AMBULATORY_CARE_PROVIDER_SITE_OTHER): Payer: Medicaid Other | Admitting: Student

## 2021-10-22 ENCOUNTER — Telehealth: Payer: Self-pay | Admitting: Student

## 2021-10-22 ENCOUNTER — Other Ambulatory Visit: Payer: Self-pay | Admitting: Student

## 2021-10-22 VITALS — BP 117/80 | HR 109 | Wt 288.0 lb

## 2021-10-22 DIAGNOSIS — Z3480 Encounter for supervision of other normal pregnancy, unspecified trimester: Secondary | ICD-10-CM

## 2021-10-22 DIAGNOSIS — O99212 Obesity complicating pregnancy, second trimester: Secondary | ICD-10-CM

## 2021-10-22 DIAGNOSIS — Z3A26 26 weeks gestation of pregnancy: Secondary | ICD-10-CM

## 2021-10-22 NOTE — Telephone Encounter (Signed)
Attempted to call patient to provide follow-up for difficulty sleeping at night. No answer at this time.

## 2021-10-22 NOTE — Progress Notes (Signed)
   PRENATAL VISIT NOTE  Subjective:  Jean Dougherty is a 31 y.o. [id number] at [redacted]w[redacted]d being seen today for ongoing prenatal care.  She is currently monitored for the following issues for this high-risk pregnancy and has Marijuana use; Obesity; H/O: C-section; Impaired glucose in pregnancy, antepartum; History of inadequate prenatal care; Bipolar disorder ([place]); Seasonal allergies; Healthcare maintenance; Supervision of high risk pregnancy, antepartum; Failed attempted termination of pregnancy without complication; and Supervision of other normal pregnancy, antepartum on their problem list.  Patient reports no complaints.  Contractions: Not present. Vag. Bleeding: None.  Movement: Present. Denies leaking of fluid.   The following portions of the patient's history were reviewed and updated as appropriate: allergies, current medications, past family history, past medical history, past social history, past surgical history and problem list.   Objective:   Vitals:   10/22/21 0815  BP: 117/80  Pulse: (!) 109  Weight: 288 lb (130.6 kg)    Fetal Status: Fetal Heart Rate (bpm): 150   Movement: Present     General:  Alert, oriented and cooperative. Patient is in no acute distress.  Skin: Skin is warm and dry. No rash noted.   Cardiovascular: Normal heart rate noted  Respiratory: Normal respiratory effort, no problems with respiration noted  Abdomen: Soft, gravid, appropriate for gestational age.  Pain/Pressure: Absent     Pelvic: Cervical exam deferred        Extremities: Normal range of motion.  Edema: None  Mental Status: Normal mood and affect. Normal behavior. Normal judgment and thought content.   Assessment and Plan:  Pregnancy: [id number] at [redacted]w[redacted]d 1. Supervision of other normal pregnancy, antepartum - Doing well, good fetal movement  2. [redacted] weeks gestation of pregnancy - Anticipatory guidance provided for follow-up visit -Plan for GTT (Early A1c - 5.1) at next visit, Tdap  3. Obesity  during pregnancy, antepartum, second trimester - Encouraged continuing taking ASA - F/U growth U/S scheduled for 11/13/21  Preterm labor symptoms and general obstetric precautions including but not limited to vaginal bleeding, contractions, leaking of fluid and fetal movement were reviewed in detail with the patient. Please refer to After Visit Summary for other counseling recommendations.   Return in about 2 weeks (around 11/05/2021) for IN-PERSON, HOB/GTT.  Future Appointments  Date Time Provider [place]  11/13/2021 10:45 AM [facility] NURSE [facility]  11/13/2021 11:00 AM [facility] US1 [facility]    Johnston Ebbs, NP

## 2021-10-22 NOTE — Progress Notes (Signed)
Pt presents for ROB. Pt has no concerns at this time.

## 2021-10-23 ENCOUNTER — Telehealth: Payer: Self-pay | Admitting: *Deleted

## 2021-10-23 NOTE — Telephone Encounter (Signed)
TC from pt regarding provider recommendations for sleep supplements. Provider attempted to call pt 6/14 however the pts phone number has changed and she did not update the office. Provider sent message to front office who forwarded to clinical pool. Provider advise given to pt. Pt verbalized understanding. All questions answered.

## 2021-11-07 ENCOUNTER — Other Ambulatory Visit: Payer: Medicaid Other

## 2021-11-07 ENCOUNTER — Encounter: Payer: Self-pay | Admitting: Family Medicine

## 2021-11-07 ENCOUNTER — Ambulatory Visit (INDEPENDENT_AMBULATORY_CARE_PROVIDER_SITE_OTHER): Payer: Medicaid Other | Admitting: Family Medicine

## 2021-11-07 VITALS — BP 116/76 | HR 112 | Wt 290.6 lb

## 2021-11-07 DIAGNOSIS — Z3A28 28 weeks gestation of pregnancy: Secondary | ICD-10-CM

## 2021-11-07 DIAGNOSIS — Z98891 History of uterine scar from previous surgery: Secondary | ICD-10-CM

## 2021-11-07 DIAGNOSIS — Z3480 Encounter for supervision of other normal pregnancy, unspecified trimester: Secondary | ICD-10-CM

## 2021-11-07 NOTE — Progress Notes (Signed)
    Subjective:  Jean Dougherty is a 31 y.o. [id number] at [redacted]w[redacted]d being seen today for ongoing prenatal care.  She is currently monitored for the following issues for this high-risk pregnancy and has Marijuana use; Obesity; H/O: C-section; History of inadequate prenatal care; Bipolar disorder (HCC); Seasonal allergies; Healthcare maintenance; Supervision of high risk pregnancy, antepartum; Failed attempted termination of pregnancy without complication; and Supervision of other normal pregnancy, antepartum on their problem list.  Patient reports no complaints.  Contractions: Not present. Vag. Bleeding: None.  Movement: Present. Denies leaking of fluid.   The following portions of the patient's history were reviewed and updated as appropriate: allergies, current medications, past family history, past medical history, past social history, past surgical history and problem list.   Objective:   Vitals:   11/07/21 1016  BP: 116/76  Pulse: (!) 112  Weight: 290 lb 9.6 oz (131.8 kg)    Fetal Status: Fetal Heart Rate (bpm): 135   Movement: Present  Presentation: Vertex (by BSUS w/ obtained fetal HR)   General:  Alert, oriented and cooperative. Patient is in no acute distress.  Skin: Skin is warm and dry. No rash noted.   Cardiovascular: Normal heart rate noted  Respiratory: Normal respiratory effort, no problems with respiration noted  Abdomen: Soft, gravid, appropriate for gestational age. Pain/Pressure: Absent     Pelvic:  Cervical exam deferred        Extremities: Normal range of motion.  Edema: None  Mental Status: Normal mood and affect. Normal behavior. Normal judgment and thought content.    Assessment and Plan:  Pregnancy: [id number] at [redacted]w[redacted]d  1. Supervision of other normal pregnancy, antepartum Doing well with normal fetal movement. Discussed expectations for labor and circumcision process.   2. [redacted] weeks gestation of pregnancy 3rd trimester labs/GTT done today. Declined Tdap.   3. H/O:  C-section Previously VBAC and desires TOLAC again. Plan to discuss further next visit.   4. Class 3 severe obesity due to excess calories without serious comorbidity in adult, unspecified BMI (HCC) MFM OB US follow up on 7/6.   Preterm labor symptoms and general obstetric precautions including but not limited to vaginal bleeding, contractions, leaking of fluid and fetal movement were reviewed in detail with the patient. Please refer to After Visit Summary for other counseling recommendations.   Return in about 2 weeks (around 11/21/2021) for HROB.   Jean Stack, DO

## 2021-11-07 NOTE — Progress Notes (Signed)
Pt presents for ROB. 28 week labs drawn today.  Declines TDAP.

## 2021-11-08 LAB — CBC
Hematocrit: 28.3 % — ABNORMAL LOW (ref 34.0–46.6)
Hemoglobin: 9.3 g/dL — ABNORMAL LOW (ref 11.1–15.9)
MCH: 27.3 pg (ref 26.6–33.0)
MCHC: 32.9 g/dL (ref 31.5–35.7)
MCV: 83 fL (ref 79–97)
Platelets: 279 10*3/uL (ref 150–450)
RBC: 3.41 x10E6/uL — ABNORMAL LOW (ref 3.77–5.28)
RDW: 12.5 % (ref 11.7–15.4)
WBC: 6.2 10*3/uL (ref 3.4–10.8)

## 2021-11-08 LAB — GLUCOSE TOLERANCE, 2 HOURS W/ 1HR
Glucose, 1 hour: 196 mg/dL — ABNORMAL HIGH (ref 70–179)
Glucose, 2 hour: 187 mg/dL — ABNORMAL HIGH (ref 70–152)
Glucose, Fasting: 107 mg/dL — ABNORMAL HIGH (ref 70–91)

## 2021-11-08 LAB — HIV ANTIBODY (ROUTINE TESTING W REFLEX): HIV Screen 4th Generation wRfx: NONREACTIVE

## 2021-11-08 LAB — RPR: RPR Ser Ql: NONREACTIVE

## 2021-11-10 ENCOUNTER — Other Ambulatory Visit: Payer: Self-pay

## 2021-11-10 ENCOUNTER — Encounter: Payer: Self-pay | Admitting: Family Medicine

## 2021-11-10 ENCOUNTER — Telehealth: Payer: Self-pay | Admitting: Family Medicine

## 2021-11-10 ENCOUNTER — Other Ambulatory Visit: Payer: Self-pay | Admitting: Family Medicine

## 2021-11-10 DIAGNOSIS — O24419 Gestational diabetes mellitus in pregnancy, unspecified control: Secondary | ICD-10-CM

## 2021-11-10 DIAGNOSIS — D508 Other iron deficiency anemias: Secondary | ICD-10-CM

## 2021-11-10 MED ORDER — ACCU-CHEK SOFTCLIX LANCETS MISC: 1.0000 | Freq: Four times a day (QID) | 12 refills | Status: DC

## 2021-11-10 MED ORDER — FERROUS SULFATE 325 (65 FE) MG PO TABS: 325.0000 mg | ORAL_TABLET | ORAL | 1 refills | Status: DC

## 2021-11-10 MED ORDER — ACCU-CHEK GUIDE VI STRP: ORAL_STRIP | 12 refills | Status: DC

## 2021-11-10 MED ORDER — ACCU-CHEK GUIDE ME W/DEVICE KIT: 1.0000 | PACK | Freq: Four times a day (QID) | 0 refills | Status: DC

## 2021-11-10 NOTE — Telephone Encounter (Signed)
Called patient to discuss lab results and MyChart message.   Patient reports frustration over concern that she would have to have a repeat C/S this pregnancy rather than TOLAC. Apologized for miscommunication (we had discussed possibility of repeat CS if fetal positioning was persistently breech throughout pregnancy, which it was vertex now during our appt) and reassured her that that was NOT the case. She may gladly continue pursue for a TOLAC at this time.   She also voiced concern that her CS was done incorrectly due to higher location on her abdomen and that she has persistent numbness on one side of it. Reports her sister/mother have had C-sections and do not have this problem. Clarified from our earlier conversation during appointment, that while not everyone will have this, some superficial nerves can be damaged with surgeries and numbness at this region can be a relatively common side effect.   Discussed we will do our best to work towards a vaginal delivery and inform her of the providers that will be present on L&D around the time of her IOL (if not spontaneous labor).   Jean Stack, DO

## 2021-11-12 ENCOUNTER — Telehealth: Payer: Self-pay | Admitting: *Deleted

## 2021-11-12 NOTE — Telephone Encounter (Signed)
Pt called to office regarding results. Attempt to return call. LVM advising pt to call office as needed.

## 2021-11-13 ENCOUNTER — Encounter: Payer: Self-pay | Admitting: *Deleted

## 2021-11-13 ENCOUNTER — Inpatient Hospital Stay (HOSPITAL_COMMUNITY)
Admission: AD | Admit: 2021-11-13 | Discharge: 2021-11-13 | Disposition: A | Discharge status: Home / Self Care | Payer: Medicaid Other | Attending: Obstetrics & Gynecology | Admitting: Obstetrics & Gynecology

## 2021-11-13 ENCOUNTER — Other Ambulatory Visit: Payer: Self-pay | Admitting: *Deleted

## 2021-11-13 ENCOUNTER — Ambulatory Visit: Payer: Medicaid Other | Admitting: *Deleted

## 2021-11-13 ENCOUNTER — Other Ambulatory Visit: Payer: Self-pay

## 2021-11-13 ENCOUNTER — Ambulatory Visit: Payer: Medicaid Other | Attending: Maternal & Fetal Medicine

## 2021-11-13 VITALS — BP 125/73 | HR 117

## 2021-11-13 DIAGNOSIS — J029 Acute pharyngitis, unspecified: Secondary | ICD-10-CM

## 2021-11-13 DIAGNOSIS — O99213 Obesity complicating pregnancy, third trimester: Secondary | ICD-10-CM

## 2021-11-13 DIAGNOSIS — O99323 Drug use complicating pregnancy, third trimester: Secondary | ICD-10-CM | POA: Insufficient documentation

## 2021-11-13 DIAGNOSIS — E669 Obesity, unspecified: Secondary | ICD-10-CM | POA: Diagnosis not present

## 2021-11-13 DIAGNOSIS — Z6841 Body Mass Index (BMI) 40.0 and over, adult: Secondary | ICD-10-CM

## 2021-11-13 DIAGNOSIS — Z362 Encounter for other antenatal screening follow-up: Secondary | ICD-10-CM | POA: Insufficient documentation

## 2021-11-13 DIAGNOSIS — O34219 Maternal care for unspecified type scar from previous cesarean delivery: Secondary | ICD-10-CM | POA: Diagnosis not present

## 2021-11-13 DIAGNOSIS — Z3A29 29 weeks gestation of pregnancy: Secondary | ICD-10-CM | POA: Diagnosis not present

## 2021-11-13 DIAGNOSIS — O99212 Obesity complicating pregnancy, second trimester: Secondary | ICD-10-CM | POA: Insufficient documentation

## 2021-11-13 DIAGNOSIS — O98513 Other viral diseases complicating pregnancy, third trimester: Secondary | ICD-10-CM | POA: Insufficient documentation

## 2021-11-13 DIAGNOSIS — O24419 Gestational diabetes mellitus in pregnancy, unspecified control: Secondary | ICD-10-CM | POA: Diagnosis not present

## 2021-11-13 LAB — GROUP A STREP BY PCR: Group A Strep by PCR: NOT DETECTED

## 2021-11-13 MED ORDER — BENZOCAINE 15 MG MT LOZG
1.0000 | LOZENGE | OROMUCOSAL | 0 refills | Status: DC | PRN
Start: 2021-11-13 — End: 2022-01-07

## 2021-11-13 NOTE — [facility] Note (Signed)
Jean Dougherty is a 31 y.o. at [redacted]w[redacted]d here in [facility] reporting: she awoke to swollen lips, sore throat, and uvula swelling.  States her voice sounds funny and it hurts to swallow.Denies nasal congestion.  Hasn't taken any meds to treat symptoms.  Denies eating thing "different". Denies VB or LOF.  Endorses +FM. LMP: N/A Onset of complaint: today Pain score: 8/10 sore throat Vitals:   11/13/21 0806  BP: 133/67  Pulse: (!) 106  Resp: 20  Temp: 98 F (36.7 C)  SpO2: 100%     FHT:126 bpm Lab orders placed from triage:   None

## 2021-11-13 NOTE — Progress Notes (Signed)
Pt states she needs to leave because she has a 1000 OB appointment.  Per Dr. Crissie Reese pt may leave and he will phone results.  Pt verbalized understanding & agreeable.

## 2021-11-13 NOTE — MAU Provider Note (Signed)
History     [id number]  Arrival date and time: 11/13/21 0754    Chief Complaint  Patient presents with   Lip swelling     HPI Jean Dougherty is a 31 y.o. at [redacted]w[redacted]d with PMHx notable for CS followed by VBAC, GDM, who presents for sore throat.   Patient reports she woke up this morning with a very sore throat and her voice not sounding like normal She feels like her lips are swollen Hurts when she eats or drinks anything No sick contacts No cough, congestion, or runny nose No fevers No leaking fluid or vaginal bleeding No contractions Endorses good fetal movements No new foods No allergies beyond seasonal that she is aware of No shortness of breath or chest pain      OB History     Gravida  4   Para  2   Term  2   Preterm  0   AB  1   Living  2      SAB  1   IAB  0   Ectopic  0   Multiple  0   Live Births  2           Past Medical History:  Diagnosis Date   Asthma    Bipolar disorder ([place])    Impaired glucose in pregnancy, antepartum 09/15/2015   1 hour 137, pt missed appt for 3 hour GTT, prenatal appt in February with next attended visit in April at 39 weeks.    Marijuana use     Past Surgical History:  Procedure Laterality Date   CESAREAN SECTION  03/09/2012   Procedure: CESAREAN SECTION;  Surgeon: Lahoma Crocker, MD;  Location: [facility];  Service: Obstetrics;  Laterality: N/A;   WISDOM TOOTH EXTRACTION      Family History  Problem Relation Age of Onset   Hypertension Father    Diabetes Father    Hypertension Maternal Uncle    Cancer Maternal Grandmother        lung cancer    Hypertension Maternal Grandfather    Diabetes Maternal Grandfather    Other Neg Hx     Social History   Socioeconomic History   Marital status: Single    Spouse name: Not on file   Number of children: Not on file   Years of education: Not on file   Highest education level: Not on file  Occupational History   Occupation: CNA  Tobacco Use    Smoking status: Never   Smokeless tobacco: Never  Vaping Use   Vaping Use: Never used  Substance and Sexual Activity   Alcohol use: No   Drug use: Yes    Types: Marijuana    Comment: last used December 2022   Sexual activity: Yes    Birth control/protection: None  Other Topics Concern   Not on file  Social History Narrative   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 Has two children 39 months old and 21 years old    Social Determinants of Health   Financial Resource Strain: Low Risk  (02/11/2018)   Overall Financial Resource Strain (CARDIA)    Difficulty of Paying Living Expenses: Not hard at all  Food Insecurity: No Food Insecurity (02/11/2018)   Hunger Vital Sign    Worried About Running Out of Food in the Last Year: Never true    Ran Out of Food in the Last Year: Never true  Transportation Needs: No Transportation Needs (02/11/2018)   PRAPARE - Transportation  Lack of Transportation (Medical): No    Lack of Transportation (Non-Medical): No  Physical Activity: Not on file  Stress: Not on file  Social Connections: Not on file  Intimate Partner Violence: Not on file    No Known Allergies  No current facility-administered medications on file prior to encounter.   Current Outpatient Medications on File Prior to Encounter  Medication Sig Dispense Refill   Accu-Chek Softclix Lancets lancets 1 each by Other route 4 (four) times daily. Check BS AM Fasting and 2 hours after each meal. 100 each 12   aspirin EC 81 MG tablet Take 1 tablet (81 mg total) by mouth daily. Take after 12 weeks for prevention of preeclampsia later in pregnancy 300 tablet 2   Blood Glucose Monitoring Suppl (ACCU-CHEK GUIDE ME) w/Device KIT 1 Device by Does not apply route 4 (four) times daily. Use with test strips to monitor Blood Sugar 1 kit 0   famotidine (PEPCID) 20 MG tablet Take 1 tablet (20 mg total) by mouth daily. 30 tablet 1   ferrous sulfate 325 (65 FE) MG tablet Take 1 tablet (325 mg total) by mouth every other day. 30  tablet 1   glucose blood (ACCU-CHEK GUIDE) test strip Check BS AM fasting and 2 hours after each meal. 100 each 12   Prenatal Vit-Fe Fumarate-FA (MULTIVITAMIN-PRENATAL) 27-0.8 MG TABS tablet Take 1 tablet by mouth daily at 12 noon.     promethazine (PHENERGAN) 25 MG tablet Take 0.5-1 tablets (12.5-25 mg total) by mouth every 6 (six) hours as needed. 30 tablet 0   promethazine (PHENERGAN) 25 MG tablet Take 1 tablet (25 mg total) by mouth every 6 (six) hours as needed for nausea or vomiting. 90 tablet 1     ROS Pertinent positives and negative per HPI, all others reviewed and negative  Physical Exam   BP 133/67 (BP Location: Right Arm)   Pulse (!) 106   Temp 98 F (36.7 C) (Oral)   Resp 20   Ht 5' 4.5" (1.638 m)   Wt 131.5 kg   LMP 04/23/2021   SpO2 100%   BMI 48.99 kg/m   Patient Vitals for the past 24 hrs:  BP Temp Temp src Pulse Resp SpO2 Height Weight  11/13/21 0806 133/67 98 F (36.7 C) Oral (!) 106 20 100 % 5' 4.5" (1.638 m) 131.5 kg    Physical Exam Vitals reviewed.  Constitutional:      General: She is not in acute distress.    Appearance: She is well-developed. She is not diaphoretic.  HENT:     Nose: No congestion or rhinorrhea.     Mouth/Throat:     Mouth: Mucous membranes are moist.     Pharynx: Posterior oropharyngeal erythema present. No oropharyngeal exudate.  Eyes:     General: No scleral icterus. Pulmonary:     Effort: Pulmonary effort is normal. No respiratory distress.  Skin:    General: Skin is warm and dry.  Neurological:     Mental Status: She is alert.     Coordination: Coordination normal.      Cervical Exam    Bedside Ultrasound Not done  My interpretation: n/a   Labs Results for orders placed or performed during the hospital encounter of 11/13/21 (from the past 24 hour(s))  Group A Strep by PCR     Status: None   Collection Time: 11/13/21  8:31 AM   Specimen: Throat; Sterile Swab  Result Value Ref Range   Group A Strep by  PCR  NOT DETECTED NOT DETECTED    Imaging No results found.  MAU Course  Procedures Lab Orders         Group A Strep by PCR    Meds ordered this encounter  Medications   Benzocaine 15 MG LOZG    Sig: Use as directed 1 lozenge (15 mg total) in the mouth or throat as needed (sore throat).    Dispense:  18 lozenge    Refill:  0   Imaging Orders  No imaging studies ordered today    MDM moderate  Assessment and Plan  #Viral pharyngitis #[redacted] weeks gestation of pregnancy Erythematous throat, Strep swab is negative. Discussed conservative measures such as salt gargles, tylenol, hot tea with honey. Will send benzocaine lozenges.  Patient left prior to test resulting, will send mychart message to notify her.   Dispo: discharged to home in stable condition.    Clarnce Flock, MD/MPH 11/13/21 9:40 AM  Allergies as of 11/13/2021   No Known Allergies      Medication List     TAKE these medications    Accu-Chek Guide Me w/Device Kit 1 Device by Does not apply route 4 (four) times daily. Use with test strips to monitor Blood Sugar   Accu-Chek Guide test strip Generic drug: glucose blood Check BS AM fasting and 2 hours after each meal.   Accu-Chek Softclix Lancets lancets 1 each by Other route 4 (four) times daily. Check BS AM Fasting and 2 hours after each meal.   aspirin EC 81 MG tablet Take 1 tablet (81 mg total) by mouth daily. Take after 12 weeks for prevention of preeclampsia later in pregnancy   Benzocaine 15 MG Lozg Use as directed 1 lozenge (15 mg total) in the mouth or throat as needed (sore throat).   famotidine 20 MG tablet Commonly known as: Pepcid Take 1 tablet (20 mg total) by mouth daily.   ferrous sulfate 325 (65 FE) MG tablet Take 1 tablet (325 mg total) by mouth every other day.   multivitamin-prenatal 27-0.8 MG Tabs tablet Take 1 tablet by mouth daily at 12 noon.   promethazine 25 MG tablet Commonly known as: PHENERGAN Take 0.5-1 tablets  (12.5-25 mg total) by mouth every 6 (six) hours as needed.   promethazine 25 MG tablet Commonly known as: PHENERGAN Take 1 tablet (25 mg total) by mouth every 6 (six) hours as needed for nausea or vomiting.

## 2021-11-17 ENCOUNTER — Other Ambulatory Visit: Payer: Self-pay | Admitting: *Deleted

## 2021-11-17 DIAGNOSIS — Z6841 Body Mass Index (BMI) 40.0 and over, adult: Secondary | ICD-10-CM

## 2021-11-17 DIAGNOSIS — O24410 Gestational diabetes mellitus in pregnancy, diet controlled: Secondary | ICD-10-CM

## 2021-11-19 ENCOUNTER — Encounter: Payer: Medicaid Other | Admitting: Obstetrics and Gynecology

## 2021-11-26 ENCOUNTER — Ambulatory Visit: Payer: Medicaid Other | Admitting: Registered"

## 2021-12-03 ENCOUNTER — Ambulatory Visit (INDEPENDENT_AMBULATORY_CARE_PROVIDER_SITE_OTHER): Payer: Medicaid Other | Admitting: Student

## 2021-12-03 VITALS — BP 120/76 | HR 121 | Wt 293.0 lb

## 2021-12-03 DIAGNOSIS — O99213 Obesity complicating pregnancy, third trimester: Secondary | ICD-10-CM

## 2021-12-03 DIAGNOSIS — O99013 Anemia complicating pregnancy, third trimester: Secondary | ICD-10-CM

## 2021-12-03 DIAGNOSIS — Z3480 Encounter for supervision of other normal pregnancy, unspecified trimester: Secondary | ICD-10-CM

## 2021-12-03 DIAGNOSIS — K219 Gastro-esophageal reflux disease without esophagitis: Secondary | ICD-10-CM

## 2021-12-03 DIAGNOSIS — O24419 Gestational diabetes mellitus in pregnancy, unspecified control: Secondary | ICD-10-CM

## 2021-12-03 DIAGNOSIS — Z3A32 32 weeks gestation of pregnancy: Secondary | ICD-10-CM

## 2021-12-03 DIAGNOSIS — Z98891 History of uterine scar from previous surgery: Secondary | ICD-10-CM

## 2021-12-03 MED ORDER — FERROUS SULFATE 325 (65 FE) MG PO TBEC: 325.0000 mg | DELAYED_RELEASE_TABLET | ORAL | 1 refills | Status: AC

## 2021-12-03 MED ORDER — PANTOPRAZOLE SODIUM 40 MG PO TBEC: 40.0000 mg | DELAYED_RELEASE_TABLET | Freq: Every day | ORAL | 0 refills | Status: DC

## 2021-12-03 NOTE — Progress Notes (Signed)
PRENATAL VISIT NOTE  Subjective:  Jean Dougherty is a 31 y.o. [id number] at [redacted]w[redacted]d being seen today for ongoing prenatal care.  She is currently monitored for the following issues for this high-risk pregnancy and has Marijuana use; Obesity; H/O: C-section; History of inadequate prenatal care; Bipolar disorder (HCC); Seasonal allergies; Healthcare maintenance; Supervision of high risk pregnancy, antepartum; Failed attempted termination of pregnancy without complication; Supervision of other normal pregnancy, antepartum; and GDM (gestational diabetes mellitus) on their problem list.  Patient reports heartburn. Patient reports that TUMS usually relieve some of the discomfort, but would like something additional.  Contractions: Irritability. Vag. Bleeding: None.  Movement: Present. Denies leaking of fluid.   The following portions of the patient's history were reviewed and updated as appropriate: allergies, current medications, past family history, past medical history, past social history, past surgical history and problem list.   Objective:   Vitals:   12/03/21 0937  BP: 120/76  Pulse: (!) 121  Weight: 293 lb (132.9 kg)    Fetal Status: Fetal Heart Rate (bpm): 131   Movement: Present     General:  Alert, oriented and cooperative. Patient is in no acute distress.  Skin: Skin is warm and dry. No rash noted.   Cardiovascular: Normal heart rate noted  Respiratory: Normal respiratory effort, no problems with respiration noted  Abdomen: Soft, gravid, appropriate for gestational age.  Pain/Pressure: Absent     Pelvic: Cervical exam deferred        Extremities: Normal range of motion.  Edema: None  Mental Status: Normal mood and affect. Normal behavior. Normal judgment and thought content.   Assessment and Plan:  Pregnancy: [id number] at [redacted]w[redacted]d 1. Supervision of other normal pregnancy, antepartum - Doing well today, no concerns, feels frequent fetal movement  2. [redacted] weeks gestation of pregnancy -  Plan for routine prenatal visit in 2 weeks  3. Gestational diabetes mellitus (GDM), antepartum, gestational diabetes method of control unspecified - Patient reports monitoring blood sugars at home and having normal readings. Encouraged to bring log of BS readings to next prenatal visit. Discussed importance of glucose monitoring and concerns r/t uncontrolled blood glucose levels. Encouraged patient to continue walking/light activities and protein rich diet - Growth [place] and BPPs have been scheduled  4. Obesity complicating pregnancy in third trimester - BMI 49.5 - Growth [place] and BPPs have been scheduled  5. H/O: C-section - Patient desires TOLAC. Patient had successful VBAC with prior pregnancy. Patient will need consent form signed prior to delivery  6. Gastroesophageal reflux disease without esophagitis - Occasional relief with TUMS - pantoprazole (PROTONIX) 40 MG tablet; Take 1 tablet (40 mg total) by mouth daily.  Dispense: 30 tablet; Refill: 0  7. Anemia affecting pregnancy in third trimester - Patient prefers PO iron, but did not pick up prior Rx - ferrous sulfate 325 (65 FE) MG EC tablet; Take 1 tablet (325 mg total) by mouth every other day.  Dispense: 45 tablet; Refill: 1  Preterm labor symptoms and general obstetric precautions including but not limited to vaginal bleeding, contractions, leaking of fluid and fetal movement were reviewed in detail with the patient. Please refer to After Visit Summary for other counseling recommendations.   Return in about 2 weeks (around 12/17/2021) for [facility], IN-PERSON.  Future Appointments  Date Time Provider Department Center  12/12/2021 10:45 AM [facility] NURSE [facility]  12/12/2021 11:00 AM [facility] US1 [facility]  12/17/2021  9:35 AM Raelyn Mora, CNM [facility] None  12/18/2021 10:45 AM [facility]  NURSE [facility]  12/18/2021 11:00 AM [facility] US1 [facility]  12/26/2021 10:45 AM [facility] NURSE [facility]  12/26/2021 11:00 AM [facility] US1 [facility]  12/31/2021  9:35 AM Adam Phenix, MD [facility] None  01/01/2022 10:45 AM [facility] NURSE [facility]  01/01/2022 11:00 AM [facility] US1 [facility]  01/07/2022  9:55 AM Hermina Staggers, MD [facility] None  01/14/2022  9:35 AM Leftwich-Kirby, Wilmer Floor, CNM [facility] None  01/21/2022  9:35 AM Corlis Hove, NP [facility] None  01/28/2022  9:35 AM Warden Fillers, MD [facility] None    Corlis Hove, NP

## 2021-12-03 NOTE — Progress Notes (Signed)
ROB [redacted] wks GA Reports decreased FM over the weekend but now movement is normal. Advised of need to seek evaluation in MAU for decreased FM in the future. Reports out of Pepcid RX, refill pended Is not taking PO Iron for anemia, education reinforced Does not have CBG log and can't recall range for fasting and post prandials. Education reinforced.

## 2021-12-04 ENCOUNTER — Telehealth: Payer: Self-pay | Admitting: Student

## 2021-12-04 ENCOUNTER — Telehealth: Payer: Self-pay | Admitting: Emergency Medicine

## 2021-12-04 NOTE — Telephone Encounter (Signed)
Telephone call to patient regarding concerns she has with the office and providers.   She had already spoken with [facility] and now felt very informed about her new diagnosis of GDM.   Will follow up with staff regarding concerns.

## 2021-12-04 NOTE — Telephone Encounter (Signed)
TC to patient to discuss patient concerns about quality of care during appointments.  Discussed in depth GDM diagnosis and risks during pregnancy and delivery.  Also discussed Iron deficient anemia in depth, and discussed methods to increase iron along with supplementation. Agreed that patient will see MD only. Agreed that patient can discuss birthing plan and VBAC desire with MD.

## 2021-12-12 ENCOUNTER — Ambulatory Visit: Payer: Medicaid Other | Admitting: *Deleted

## 2021-12-12 ENCOUNTER — Ambulatory Visit: Payer: Medicaid Other | Attending: Maternal & Fetal Medicine

## 2021-12-12 VITALS — BP 116/75 | HR 112

## 2021-12-12 DIAGNOSIS — O99213 Obesity complicating pregnancy, third trimester: Secondary | ICD-10-CM | POA: Insufficient documentation

## 2021-12-12 DIAGNOSIS — O34219 Maternal care for unspecified type scar from previous cesarean delivery: Secondary | ICD-10-CM | POA: Diagnosis not present

## 2021-12-12 DIAGNOSIS — O24419 Gestational diabetes mellitus in pregnancy, unspecified control: Secondary | ICD-10-CM

## 2021-12-12 DIAGNOSIS — Z3A33 33 weeks gestation of pregnancy: Secondary | ICD-10-CM

## 2021-12-12 DIAGNOSIS — E669 Obesity, unspecified: Secondary | ICD-10-CM | POA: Diagnosis not present

## 2021-12-17 ENCOUNTER — Encounter: Payer: Medicaid Other | Admitting: Obstetrics and Gynecology

## 2021-12-18 ENCOUNTER — Encounter: Payer: Self-pay | Admitting: Obstetrics & Gynecology

## 2021-12-18 ENCOUNTER — Ambulatory Visit: Payer: Medicaid Other | Admitting: *Deleted

## 2021-12-18 ENCOUNTER — Ambulatory Visit (INDEPENDENT_AMBULATORY_CARE_PROVIDER_SITE_OTHER): Payer: Medicaid Other | Admitting: Obstetrics & Gynecology

## 2021-12-18 ENCOUNTER — Other Ambulatory Visit: Payer: Self-pay | Admitting: Maternal & Fetal Medicine

## 2021-12-18 ENCOUNTER — Ambulatory Visit: Payer: Medicaid Other | Attending: Maternal & Fetal Medicine

## 2021-12-18 ENCOUNTER — Encounter: Payer: Self-pay | Admitting: *Deleted

## 2021-12-18 VITALS — BP 124/84 | HR 112 | Wt 294.0 lb

## 2021-12-18 VITALS — BP 97/82 | HR 119

## 2021-12-18 DIAGNOSIS — O24410 Gestational diabetes mellitus in pregnancy, diet controlled: Secondary | ICD-10-CM | POA: Diagnosis not present

## 2021-12-18 DIAGNOSIS — E669 Obesity, unspecified: Secondary | ICD-10-CM

## 2021-12-18 DIAGNOSIS — O99213 Obesity complicating pregnancy, third trimester: Secondary | ICD-10-CM | POA: Diagnosis not present

## 2021-12-18 DIAGNOSIS — O3663X0 Maternal care for excessive fetal growth, third trimester, not applicable or unspecified: Secondary | ICD-10-CM

## 2021-12-18 DIAGNOSIS — O24419 Gestational diabetes mellitus in pregnancy, unspecified control: Secondary | ICD-10-CM

## 2021-12-18 DIAGNOSIS — O34219 Maternal care for unspecified type scar from previous cesarean delivery: Secondary | ICD-10-CM | POA: Diagnosis not present

## 2021-12-18 DIAGNOSIS — Z3A34 34 weeks gestation of pregnancy: Secondary | ICD-10-CM | POA: Diagnosis present

## 2021-12-18 DIAGNOSIS — Z98891 History of uterine scar from previous surgery: Secondary | ICD-10-CM

## 2021-12-18 DIAGNOSIS — O0993 Supervision of high risk pregnancy, unspecified, third trimester: Secondary | ICD-10-CM

## 2021-12-18 MED ORDER — METFORMIN HCL 500 MG PO TABS: 500.0000 mg | ORAL_TABLET | Freq: Two times a day (BID) | ORAL | 5 refills | Status: DC

## 2021-12-18 NOTE — Patient Instructions (Signed)
Return to office for any scheduled appointments. Call the office or go to the MAU at [facility] [place] if: You begin to have strong, frequent contractions Your water breaks.  Sometimes it is a big gush of fluid, sometimes it is just a trickle that keeps getting your underwear wet or running down your legs You have vaginal bleeding.  It is normal to have a small amount of spotting if your cervix was checked.  You do not feel your baby moving like normal.  If you do not, get something to eat and drink and lay down and focus on feeling your baby move.   If your baby is still not moving like normal, you should call the office or go to MAU. Any other obstetric concerns.  

## 2021-12-18 NOTE — Progress Notes (Signed)
   PRENATAL VISIT NOTE  Subjective:  Jean Dougherty is a 31 y.o. [id number] at [redacted]w[redacted]d being seen today for ongoing prenatal care.  She is currently monitored for the following issues for this high-risk pregnancy and has Morbid obesity (HCC); History of VBAC; Bipolar disorder (HCC); Supervision of high-risk pregnancy; and GDM (gestational diabetes mellitus) on their problem list.  Patient reports no complaints.  Contractions: Not present. Vag. Bleeding: None.  Movement: Present. Denies leaking of fluid.   The following portions of the patient's history were reviewed and updated as appropriate: allergies, current medications, past family history, past medical history, past social history, past surgical history and problem list.   Objective:   Vitals:   12/18/21 1446  BP: 124/84  Pulse: (!) 112  Weight: 294 lb (133.4 kg)    Fetal Status: Fetal Heart Rate (bpm):  142   Movement: Present     General:  Alert, oriented and cooperative. Patient is in no acute distress.  Skin: Skin is warm and dry. No rash noted.   Cardiovascular: Normal heart rate noted  Respiratory: Normal respiratory effort, no problems with respiration noted  Abdomen: Soft, gravid, appropriate for gestational age.  Pain/Pressure: Present     Pelvic: Cervical exam deferred        Extremities: Normal range of motion.  Edema: None  Mental Status: Normal mood and affect. Normal behavior. Normal judgment and thought content.   Assessment and Plan:  Pregnancy: [id number] at [redacted]w[redacted]d 1. Gestational diabetes mellitus (GDM), antepartum, gestational diabetes method of control unspecified All elevated BS, fastings 95-115, PP 130s-170s, a couple of 200s.  Counseled about need for medication management. Discussed insulin vs Metformin, she desires Metformin for now. Prescribed Metformin, will adjust as needed. Already scheduled for weekly antenatal surveillance given morbid obesity. - metFORMIN (GLUCOPHAGE) 500 MG tablet; Take 1 tablet (500 mg  total) by mouth 2 (two) times daily with a meal.  Dispense: 60 tablet; Refill: 5  2. History of VBAC Counseled regarding TOLAC vs RCS; risks/benefits discussed in detail. All questions answered.  Patient elects for TOLAC, consent signed 12/18/2021.  3. [redacted] weeks gestation of pregnancy 4. Supervision of high risk pregnancy in third trimester No other concerns. Preterm labor symptoms and general obstetric precautions including but not limited to vaginal bleeding, contractions, leaking of fluid and fetal movement were reviewed in detail with the patient. Please refer to After Visit Summary for other counseling recommendations.   Return in about 13 days (around 12/31/2021) for Pelvic cultures, OFFICE OB VISIT (MD only).  Future Appointments  Date Time Provider Department Center  12/26/2021 10:45 AM [facility] NURSE [facility]  12/26/2021 11:00 AM [facility] US1 [facility]  12/31/2021  9:35 AM Adam Phenix, MD [facility] None  01/01/2022 10:45 AM [facility] NURSE [facility]  01/01/2022 11:00 AM [facility] US1 [facility]  01/07/2022  9:55 AM Hermina Staggers, MD [facility] None  01/14/2022  9:35 AM Aziel Morgan, Jethro Bastos, MD [facility] None  01/21/2022  9:35 AM Warden Fillers, MD [facility] None  01/28/2022  9:35 AM Warden Fillers, MD [facility] None    Jaynie Collins, MD

## 2021-12-25 ENCOUNTER — Encounter (HOSPITAL_COMMUNITY): Payer: Self-pay | Admitting: Obstetrics & Gynecology

## 2021-12-25 ENCOUNTER — Inpatient Hospital Stay (HOSPITAL_COMMUNITY)
Admission: AD | Admit: 2021-12-25 | Discharge: 2021-12-25 | Disposition: A | Discharge status: Home / Self Care | Payer: Medicaid Other | Attending: Obstetrics & Gynecology | Admitting: Obstetrics & Gynecology

## 2021-12-25 ENCOUNTER — Other Ambulatory Visit: Payer: Self-pay

## 2021-12-25 ENCOUNTER — Inpatient Hospital Stay (HOSPITAL_BASED_OUTPATIENT_CLINIC_OR_DEPARTMENT_OTHER): Payer: Medicaid Other

## 2021-12-25 DIAGNOSIS — O24415 Gestational diabetes mellitus in pregnancy, controlled by oral hypoglycemic drugs: Secondary | ICD-10-CM | POA: Insufficient documentation

## 2021-12-25 DIAGNOSIS — Z3A35 35 weeks gestation of pregnancy: Secondary | ICD-10-CM

## 2021-12-25 DIAGNOSIS — Z3689 Encounter for other specified antenatal screening: Secondary | ICD-10-CM

## 2021-12-25 DIAGNOSIS — O368130 Decreased fetal movements, third trimester, not applicable or unspecified: Secondary | ICD-10-CM | POA: Diagnosis not present

## 2021-12-25 DIAGNOSIS — O368133 Decreased fetal movements, third trimester, fetus 3: Secondary | ICD-10-CM

## 2021-12-25 LAB — GLUCOSE, CAPILLARY: Glucose-Capillary: 106 mg/dL — ABNORMAL HIGH (ref 70–99)

## 2021-12-25 NOTE — MAU Note (Signed)
Jean Dougherty is a 31 y.o. at [redacted]w[redacted]d here in MAU reporting: decreased FM today, reports not moving as much as usual.  Denies VB or LOF.  Reports has eaten today @ 0900 LMP: N/A Onset of complaint: today Pain score: 0 Vitals:   12/25/21 1219  BP: (!) 140/61  Pulse: (!) 113  Resp: 18  Temp: 98.1 F (36.7 C)  SpO2: 99%     FHT:140 bpm Lab orders placed from triage:   UA

## 2021-12-25 NOTE — MAU Provider Note (Signed)
History     CSN: [id number]  Arrival date and time: 12/25/21 1206   Event Date/Time   First Provider Initiated Contact with Patient 12/25/21 1306      Chief Complaint  Patient presents with   Decreased Fetal Movement   Jean Dougherty is a 31 y.o. [id number] at [redacted]w[redacted]d who presents today with decreased fetal movement. She reports that yesterday she felt normal movement all day and that usually she feels fetal movement "all day long". However, today she has only felt some minor "streches" which is different from what she usually feels. She denies any contractions, abdominal pain, vaginal bleeding or LOF. Patient has GDM and she is taking Metformin $RemoveBeforeDE'500mg'syeJBTitzZAForl$  BID. She states that she has been taking this as directed. However, she isn't checking her blood sugar very often. She states that she was started on this medication because her blood sugar was elevated first thing in the morning and since starting it her blood sugar has still been elevated in the morning. She last took her blood sugar on 12/24/2021 at 7:00am and states that was 130 and she had one other post meal reading on 12/24/2021 that was 120. She has not checked today.     OB History     Gravida  4   Para  2   Term  2   Preterm  0   AB  1   Living  2      SAB  1   IAB  0   Ectopic  0   Multiple  0   Live Births  2           Past Medical History:  Diagnosis Date   Asthma    Bipolar disorder ([place])    Failed attempted termination of pregnancy without complication 01/11/2354   Patient took mifepristone/misoprostol at 8 weeks for medical abortion which failed, was advised to have surgical termination which she declined. Desires to continue pregnancy, counseled in MAU 02/10/18 about potential fetal anomalies/malformations from prostaglandin exposure.   Impaired glucose in pregnancy, antepartum 09/15/2015   1 hour 137, pt missed appt for 3 hour GTT, prenatal appt in February with next attended visit in April at 3 weeks.     Marijuana use    Seasonal allergies 04/27/2016    Past Surgical History:  Procedure Laterality Date   CESAREAN SECTION  03/09/2012   Procedure: CESAREAN SECTION;  Surgeon: Lahoma Crocker, MD;  Location: [facility];  Service: Obstetrics;  Laterality: N/A;   WISDOM TOOTH EXTRACTION      Family History  Problem Relation Age of Onset   Hypertension Father    Diabetes Father    Hypertension Maternal Uncle    Cancer Maternal Grandmother        lung cancer    Hypertension Maternal Grandfather    Diabetes Maternal Grandfather    Other Neg Hx     Social History   Tobacco Use   Smoking status: Never   Smokeless tobacco: Never  Vaping Use   Vaping Use: Never used  Substance Use Topics   Alcohol use: No   Drug use: Yes    Types: Marijuana    Comment: last smoked December 2022    Allergies: No Known Allergies  Medications Prior to Admission  Medication Sig Dispense Refill Last Dose   aspirin EC 81 MG tablet Take 1 tablet (81 mg total) by mouth daily. Take after 12 weeks for prevention of preeclampsia later in pregnancy 300 tablet 2 12/25/2021  ferrous sulfate 325 (65 FE) MG EC tablet Take 1 tablet (325 mg total) by mouth every other day. 45 tablet 1 12/25/2021   metFORMIN (GLUCOPHAGE) 500 MG tablet Take 1 tablet (500 mg total) by mouth 2 (two) times daily with a meal. 60 tablet 5 12/25/2021   pantoprazole (PROTONIX) 40 MG tablet Take 1 tablet (40 mg total) by mouth daily. 30 tablet 0 12/25/2021   Prenatal Vit-Fe Fumarate-FA (MULTIVITAMIN-PRENATAL) 27-0.8 MG TABS tablet Take 1 tablet by mouth daily at 12 noon.   12/25/2021   promethazine (PHENERGAN) 25 MG tablet Take 0.5-1 tablets (12.5-25 mg total) by mouth every 6 (six) hours as needed. 30 tablet 0 12/25/2021   Accu-Chek Softclix Lancets lancets 1 each by Other route 4 (four) times daily. Check BS AM Fasting and 2 hours after each meal. 100 each 12    Benzocaine 15 MG LOZG Use as directed 1 lozenge (15 mg total) in the mouth or  throat as needed (sore throat). (Patient not taking: Reported on 12/12/2021) 18 lozenge 0    Blood Glucose Monitoring Suppl (ACCU-CHEK GUIDE ME) w/Device KIT 1 Device by Does not apply route 4 (four) times daily. Use with test strips to monitor Blood Sugar 1 kit 0    famotidine (PEPCID) 20 MG tablet Take 1 tablet (20 mg total) by mouth daily. (Patient not taking: Reported on 12/03/2021) 30 tablet 1    glucose blood (ACCU-CHEK GUIDE) test strip Check BS AM fasting and 2 hours after each meal. (Patient not taking: Reported on 11/13/2021) 100 each 12    promethazine (PHENERGAN) 25 MG tablet Take 1 tablet (25 mg total) by mouth every 6 (six) hours as needed for nausea or vomiting. 90 tablet 1     Review of Systems  All other systems reviewed and are negative.  Physical Exam   Blood pressure 109/62, pulse (!) 139, temperature 98.1 F (36.7 C), temperature source Oral, resp. rate 18, height 5\' 4"  (1.626 m), weight 133.4 kg, last menstrual period 04/23/2021, SpO2 97 %, unknown if currently breastfeeding.  Physical Exam Constitutional:      Appearance: She is well-developed.  HENT:     Head: Normocephalic.  Eyes:     Pupils: Pupils are equal, round, and reactive to light.  Cardiovascular:     Rate and Rhythm: Normal rate and regular rhythm.     Heart sounds: Normal heart sounds.  Pulmonary:     Effort: Pulmonary effort is normal. No respiratory distress.     Breath sounds: Normal breath sounds.  Abdominal:     Palpations: Abdomen is soft.     Tenderness: There is no abdominal tenderness.  Genitourinary:    Vagina: No bleeding. Vaginal discharge: mucusy.    Comments: External: no lesion Vagina: small amount of white discharge     Musculoskeletal:        General: Normal range of motion.     Cervical back: Normal range of motion and neck supple.  Skin:    General: Skin is warm and dry.  Neurological:     Mental Status: She is alert and oriented to person, place, and time.  Psychiatric:         Mood and Affect: Mood normal.        Behavior: Behavior normal.      NST:  Baseline: 135 Variability: moderate Accels: 15x15 Decels: none Toco: none Reactive/Appropriate for GA  Results for orders placed or performed during the hospital encounter of 12/25/21 (from the past 24 hour(s))  Glucose, capillary     Status: Abnormal   Collection Time: 12/25/21  1:21 PM  Result Value Ref Range   Glucose-Capillary 106 (H) 70 - 99 mg/dL     MAU Course  Procedures  MDM BPP 6/8 with reactive NST = 8/10  4:08 PM : DW Dr. Nelda Marseille, patient has [place] with MFM tomorrow. Plan for DC home at this time, FU with MFM tomorrow. Will message [facility] to start antenatal testing with patient due to limited compliance with blood sugar testing on meds.   Assessment and Plan   1. Decreased fetal movements in third trimester, single or unspecified fetus   2. NST (non-stress test) reactive   3. Gestational diabetes mellitus (GDM) in third trimester controlled on oral hypoglycemic drug    DC home in stable condition  Comfort measures reviewed  3rd Trimester precautions  PTL precautions  Fetal kick counts RX: none  Return to MAU as needed FU with OB as planned   [facility] up.   Specialty: Maternal and Fetal Medicine Why: As scheduled Contact information: [address], [address] [place] [address] [phone]        [place] Follow up.   Why: As scheduled Contact information: [address] [place] [address] [place] DNP, CNM  12/25/21  4:12 PM

## 2021-12-25 NOTE — Progress Notes (Signed)
Pt removed EFM to use restroom

## 2021-12-26 ENCOUNTER — Other Ambulatory Visit: Payer: Self-pay | Admitting: *Deleted

## 2021-12-26 ENCOUNTER — Ambulatory Visit: Payer: Medicaid Other | Admitting: *Deleted

## 2021-12-26 ENCOUNTER — Encounter: Payer: Self-pay | Admitting: Obstetrics & Gynecology

## 2021-12-26 ENCOUNTER — Ambulatory Visit: Payer: Medicaid Other | Attending: Maternal & Fetal Medicine

## 2021-12-26 VITALS — BP 104/67 | HR 113

## 2021-12-26 DIAGNOSIS — O24415 Gestational diabetes mellitus in pregnancy, controlled by oral hypoglycemic drugs: Secondary | ICD-10-CM

## 2021-12-26 DIAGNOSIS — O24419 Gestational diabetes mellitus in pregnancy, unspecified control: Secondary | ICD-10-CM | POA: Insufficient documentation

## 2021-12-26 DIAGNOSIS — O99213 Obesity complicating pregnancy, third trimester: Secondary | ICD-10-CM | POA: Insufficient documentation

## 2021-12-26 DIAGNOSIS — O34219 Maternal care for unspecified type scar from previous cesarean delivery: Secondary | ICD-10-CM

## 2021-12-31 ENCOUNTER — Telehealth: Payer: Self-pay | Admitting: Obstetrics & Gynecology

## 2021-12-31 ENCOUNTER — Other Ambulatory Visit (HOSPITAL_COMMUNITY)
Admission: RE | Admit: 2021-12-31 | Discharge: 2021-12-31 | Disposition: A | Discharge status: Home / Self Care | Payer: Medicaid Other | Source: Ambulatory Visit | Attending: Obstetrics & Gynecology | Admitting: Obstetrics & Gynecology

## 2021-12-31 ENCOUNTER — Ambulatory Visit (INDEPENDENT_AMBULATORY_CARE_PROVIDER_SITE_OTHER): Payer: Medicaid Other | Admitting: Obstetrics & Gynecology

## 2021-12-31 DIAGNOSIS — Z98891 History of uterine scar from previous surgery: Secondary | ICD-10-CM

## 2021-12-31 DIAGNOSIS — O24419 Gestational diabetes mellitus in pregnancy, unspecified control: Secondary | ICD-10-CM | POA: Insufficient documentation

## 2021-12-31 DIAGNOSIS — O0993 Supervision of high risk pregnancy, unspecified, third trimester: Secondary | ICD-10-CM

## 2021-12-31 MED ORDER — METFORMIN HCL 500 MG PO TABS: 1000.0000 mg | ORAL_TABLET | Freq: Two times a day (BID) | ORAL | 5 refills | Status: DC

## 2021-12-31 NOTE — Progress Notes (Signed)
   PRENATAL VISIT NOTE  Subjective:  Jean Dougherty is a 31 y.o. [id number] at [redacted]w[redacted]d being seen today for ongoing prenatal care.  She is currently monitored for the following issues for this high-risk pregnancy and has Morbid obesity (HCC); History of VBAC; Bipolar disorder (HCC); Supervision of high-risk pregnancy; and GDM (gestational diabetes mellitus) on their problem list.  Patient reports no complaints.  Contractions: Irregular. Vag. Bleeding: None.  Movement: Present. Denies leaking of fluid.   The following portions of the patient's history were reviewed and updated as appropriate: allergies, current medications, past family history, past medical history, past social history, past surgical history and problem list.   Objective:   Vitals:   12/31/21 0933  BP: 122/76  Pulse: (!) 121  Weight: 297 lb (134.7 kg)    Fetal Status: Fetal Heart Rate (bpm): 140   Movement: Present     General:  Alert, oriented and cooperative. Patient is in no acute distress.  Skin: Skin is warm and dry. No rash noted.   Cardiovascular: Normal heart rate noted  Respiratory: Normal respiratory effort, no problems with respiration noted  Abdomen: Soft, gravid, appropriate for gestational age.  Pain/Pressure: Present     Pelvic: Cervical exam performed in the presence of a chaperone        Extremities: Normal range of motion.     Mental Status: Normal mood and affect. Normal behavior. Normal judgment and thought content.   Assessment and Plan:  Pregnancy: [id number] at [redacted]w[redacted]d 1. Gestational diabetes mellitus (GDM), antepartum, gestational diabetes method of control FBS unspecified FBS >100, PP are in range - Cervicovaginal ancillary only( [place]) - Culture, beta strep (group b only) - metFORMIN (GLUCOPHAGE) 500 MG tablet; Take 2 tablets (1,000 mg total) by mouth 2 (two) times daily with a meal.  Dispense: 60 tablet; Refill: 5  2. Morbid obesity (HCC) Body mass index is 50.98 kg/m.   3. History of  VBAC Considering repeat CS due to concern of LGA  4. Supervision of high risk pregnancy in third trimester Weekly testing in MFM, may delivery by 38 weeks but will not schedule today  Preterm labor symptoms and general obstetric precautions including but not limited to vaginal bleeding, contractions, leaking of fluid and fetal movement were reviewed in detail with the patient. Please refer to After Visit Summary for other counseling recommendations.   Return in about 1 week (around 01/07/2022).  Future Appointments  Date Time Provider Department Center  01/01/2022 10:45 AM [facility] NURSE [facility]  01/01/2022 11:00 AM [facility] US1 [facility]  01/07/2022  9:55 AM Hermina Staggers, MD [facility] None  01/09/2022  1:45 PM [facility] NURSE [facility]  01/09/2022  2:00 PM [facility] US1 [facility]  01/14/2022  9:35 AM Anyanwu, Jethro Bastos, MD [facility] None  01/21/2022  9:35 AM Warden Fillers, MD [facility] None  01/28/2022  9:35 AM Warden Fillers, MD [facility] None    Scheryl Darter, MD

## 2021-12-31 NOTE — Progress Notes (Signed)
Pt states her blood sugars have not been well controlled.  Pt is interested in having a repeat c/s.

## 2021-12-31 NOTE — Telephone Encounter (Signed)
Patient just called following her OB visit.  She was frustrated and concerned about her delivery plan not being finalized yet.  She has been told by MFM she may need to have a scheduled C/Section at 37 weeks due to the size of her baby and her uncontrolled GDM.  She feels the Metformin is not helping.  Her original desire was to have a VBAC delivery but is very nervous now and wants to do whatever we feel is safest for her and her baby.    Routing concerns and request for chart review to Dr. Jolayne Panther.  With the upcoming holiday it would be beneficial to go ahead and get the c/section scheduled if needed while OR space is still available.   Request call back to patient for her reassurance.

## 2022-01-01 ENCOUNTER — Encounter: Payer: Self-pay | Admitting: *Deleted

## 2022-01-01 ENCOUNTER — Ambulatory Visit: Payer: Medicaid Other | Admitting: *Deleted

## 2022-01-01 ENCOUNTER — Other Ambulatory Visit: Payer: Self-pay | Admitting: Maternal & Fetal Medicine

## 2022-01-01 ENCOUNTER — Ambulatory Visit: Payer: Medicaid Other | Attending: Maternal & Fetal Medicine

## 2022-01-01 VITALS — BP 128/55 | HR 103

## 2022-01-01 DIAGNOSIS — O24419 Gestational diabetes mellitus in pregnancy, unspecified control: Secondary | ICD-10-CM | POA: Diagnosis present

## 2022-01-01 DIAGNOSIS — Z6841 Body Mass Index (BMI) 40.0 and over, adult: Secondary | ICD-10-CM | POA: Insufficient documentation

## 2022-01-01 DIAGNOSIS — E669 Obesity, unspecified: Secondary | ICD-10-CM

## 2022-01-01 DIAGNOSIS — O283 Abnormal ultrasonic finding on antenatal screening of mother: Secondary | ICD-10-CM | POA: Insufficient documentation

## 2022-01-01 DIAGNOSIS — O3663X0 Maternal care for excessive fetal growth, third trimester, not applicable or unspecified: Secondary | ICD-10-CM

## 2022-01-01 DIAGNOSIS — O34219 Maternal care for unspecified type scar from previous cesarean delivery: Secondary | ICD-10-CM | POA: Diagnosis not present

## 2022-01-01 DIAGNOSIS — Z3A36 36 weeks gestation of pregnancy: Secondary | ICD-10-CM

## 2022-01-01 DIAGNOSIS — O24415 Gestational diabetes mellitus in pregnancy, controlled by oral hypoglycemic drugs: Secondary | ICD-10-CM

## 2022-01-01 DIAGNOSIS — O99213 Obesity complicating pregnancy, third trimester: Secondary | ICD-10-CM | POA: Diagnosis present

## 2022-01-01 DIAGNOSIS — O24410 Gestational diabetes mellitus in pregnancy, diet controlled: Secondary | ICD-10-CM | POA: Diagnosis present

## 2022-01-01 LAB — CERVICOVAGINAL ANCILLARY ONLY
Chlamydia: NEGATIVE
Comment: NEGATIVE
Comment: NORMAL
Neisseria Gonorrhea: NEGATIVE

## 2022-01-01 NOTE — Procedures (Signed)
Jean Dougherty 27-Nov-1990 [redacted]w[redacted]d  Fetus A Non-Stress Test Interpretation for 01/01/22  Indication: Gestational Diabetes medication controlled and obese  Fetal Heart Rate A Mode: External Baseline Rate (A): 125 bpm Variability: Moderate Accelerations: 15 x 15 Decelerations: None Multiple birth?: No  Uterine Activity Mode: Toco Contraction Frequency (min): ui Resting Tone Palpated: Relaxed  Interpretation (Fetal Testing) Nonstress Test Interpretation: Reactive Overall Impression: Reassuring for gestational age Comments: tracing reviewed by Dr.Shankar

## 2022-01-04 LAB — CULTURE, BETA STREP (GROUP B ONLY): Strep Gp B Culture: NEGATIVE

## 2022-01-05 ENCOUNTER — Telehealth: Payer: Self-pay | Admitting: Obstetrics and Gynecology

## 2022-01-05 ENCOUNTER — Other Ambulatory Visit: Payer: Self-pay | Admitting: Obstetrics and Gynecology

## 2022-01-05 DIAGNOSIS — O0993 Supervision of high risk pregnancy, unspecified, third trimester: Secondary | ICD-10-CM

## 2022-01-05 NOTE — Telephone Encounter (Signed)
Telephone call to patient to give her her c/section date and time 01/08/22 at 11:00am.  Patient instructed.

## 2022-01-06 ENCOUNTER — Encounter (HOSPITAL_COMMUNITY): Payer: Self-pay

## 2022-01-06 NOTE — Patient Instructions (Addendum)
Jean Dougherty  01/06/2022   Your procedure is scheduled on:  01/08/2022  Arrive at 0915 at Entrance C on [employer] at [facility]  and [employer]. You are invited to use the FREE valet parking or use the Visitor's parking deck.  Pick up the phone at the desk and dial [phone].  Call this number if you have problems the morning of surgery: [phone]  Remember:   Do not eat food:(After Midnight) Desps de medianoche.  Do not drink clear liquids: (After Midnight) Desps de medianoche.  Take these medicines the morning of surgery with A SIP OF WATER:  Take pantoprazole as prescribed   Do not wear jewelry, make-up or nail polish.  Do not wear lotions, powders, or perfumes. Do not wear deodorant.  Do not shave 48 hours prior to surgery.  Do not bring valuables to the hospital.  [facility] is not   responsible for any belongings or valuables brought to the hospital.  Contacts, dentures or bridgework may not be worn into surgery.  Leave suitcase in the car. After surgery it may be brought to your room.  For patients admitted to the hospital, checkout time is 11:00 AM the day of              discharge.      Please read over the following fact sheets that you were given:     Preparing for Surgery

## 2022-01-07 ENCOUNTER — Encounter: Payer: Self-pay | Admitting: Obstetrics and Gynecology

## 2022-01-07 ENCOUNTER — Ambulatory Visit (INDEPENDENT_AMBULATORY_CARE_PROVIDER_SITE_OTHER): Payer: Medicaid Other | Admitting: Obstetrics and Gynecology

## 2022-01-07 ENCOUNTER — Encounter (HOSPITAL_COMMUNITY)
Admission: RE | Admit: 2022-01-07 | Discharge: 2022-01-07 | Disposition: A | Discharge status: Home / Self Care | Payer: Medicaid Other | Source: Ambulatory Visit | Attending: Obstetrics | Admitting: Obstetrics

## 2022-01-07 VITALS — BP 120/76 | HR 107 | Wt 295.0 lb

## 2022-01-07 DIAGNOSIS — O0993 Supervision of high risk pregnancy, unspecified, third trimester: Secondary | ICD-10-CM | POA: Diagnosis present

## 2022-01-07 DIAGNOSIS — Z98891 History of uterine scar from previous surgery: Secondary | ICD-10-CM

## 2022-01-07 DIAGNOSIS — O24415 Gestational diabetes mellitus in pregnancy, controlled by oral hypoglycemic drugs: Secondary | ICD-10-CM

## 2022-01-07 DIAGNOSIS — Z3A37 37 weeks gestation of pregnancy: Secondary | ICD-10-CM

## 2022-01-07 LAB — CBC
HCT: 33.7 % — ABNORMAL LOW (ref 36.0–46.0)
Hemoglobin: 11 g/dL — ABNORMAL LOW (ref 12.0–15.0)
MCH: 27 pg (ref 26.0–34.0)
MCHC: 32.6 g/dL (ref 30.0–36.0)
MCV: 82.8 fL (ref 80.0–100.0)
Platelets: 272 10*3/uL (ref 150–400)
RBC: 4.07 MIL/uL (ref 3.87–5.11)
RDW: 17.5 % — ABNORMAL HIGH (ref 11.5–15.5)
WBC: 4.7 10*3/uL (ref 4.0–10.5)
nRBC: 0 % (ref 0.0–0.2)

## 2022-01-07 LAB — RPR: RPR Ser Ql: NONREACTIVE

## 2022-01-07 LAB — TYPE AND SCREEN
ABO/RH(D): A POS
Antibody Screen: NEGATIVE

## 2022-01-07 NOTE — Anesthesia Preprocedure Evaluation (Signed)
Anesthesia Evaluation  Patient identified by MRN, date of birth, ID band Patient awake    Reviewed: Allergy & Precautions, NPO status , Patient's Chart, lab work & pertinent test results  Airway Mallampati: III  TM Distance: >3 FB Neck ROM: Full    Dental no notable dental hx. (+) Chipped, Dental Advisory Given   Pulmonary asthma , Patient abstained from smoking.,    Pulmonary exam normal breath sounds clear to auscultation       Cardiovascular Exercise Tolerance: Good Normal cardiovascular exam Rhythm:Regular Rate:Normal     Neuro/Psych PSYCHIATRIC DISORDERS Bipolar Disorder    GI/Hepatic negative GI ROS, Neg liver ROS,   Endo/Other  diabetes, GestationalMorbid obesity (BMI 50.6)  Renal/GU      Musculoskeletal   Abdominal (+) + obese,   Peds  Hematology Lab Results      Component                Value               Date                      WBC                      4.7                 01/07/2022                HGB                      11.0 (L)            01/07/2022                HCT                      33.7 (L)            01/07/2022                MCV                      82.8                01/07/2022                PLT                      272                 01/07/2022              Anesthesia Other Findings   Reproductive/Obstetrics (+) Pregnancy                           Anesthesia Physical Anesthesia Plan  ASA: 3  Anesthesia Plan: Spinal   Post-op Pain Management: Regional block*, Toradol IV (intra-op)* and Minimal or no pain anticipated   Induction:   PONV Risk Score and Plan: Treatment may vary due to age or medical condition  Airway Management Planned: Natural Airway, Simple Face Mask and Nasal Cannula  Additional Equipment: None  Intra-op Plan:   Post-operative Plan:   Informed Consent: I have reviewed the patients History and Physical, chart, labs and discussed  the procedure including the risks, benefits and alternatives for the proposed anesthesia with the patient or authorized  representative who has indicated his/her understanding and acceptance.     Dental advisory given  Plan Discussed with:   Anesthesia Plan Comments: (37.1 wk G4P2 for rpt C/S)      Anesthesia Quick Evaluation

## 2022-01-07 NOTE — Progress Notes (Signed)
Subjective:  Jean Dougherty is a 31 y.o. [id number] at [redacted]w[redacted]d being seen today for ongoing prenatal care.  She is currently monitored for the following issues for this high-risk pregnancy and has Morbid obesity (HCC); History of VBAC; Bipolar disorder (HCC); Supervision of high-risk pregnancy; GDM (gestational diabetes mellitus); and History of cesarean section on their problem list.  Patient reports general discomforts of pregnancy.  Contractions: Irregular. Vag. Bleeding: None.  Movement: Present. Denies leaking of fluid.   The following portions of the patient's history were reviewed and updated as appropriate: allergies, current medications, past family history, past medical history, past social history, past surgical history and problem list. Problem list updated.  Objective:   Vitals:   01/07/22 0930  BP: 120/76  Pulse: (!) 107  Weight: 295 lb (133.8 kg)    Fetal Status: Fetal Heart Rate (bpm): 145   Movement: Present     General:  Alert, oriented and cooperative. Patient is in no acute distress.  Skin: Skin is warm and dry. No rash noted.   Cardiovascular: Normal heart rate noted  Respiratory: Normal respiratory effort, no problems with respiration noted  Abdomen: Soft, gravid, appropriate for gestational age. Pain/Pressure: Present     Pelvic:  Cervical exam deferred        Extremities: Normal range of motion.     Mental Status: Normal mood and affect. Normal behavior. Normal judgment and thought content.   Urinalysis:      Assessment and Plan:  Pregnancy: [id number] at [redacted]w[redacted]d  1. Supervision of high risk pregnancy in third trimester Stable  2. Gestational diabetes mellitus (GDM) in third trimester controlled on oral hypoglycemic drug Uncontrolled For repeat c section tomorrow C section reviewed with pt and information provided Growth 99 % on 01/01/22  3. History of cesarean section See above  Term labor symptoms and general obstetric precautions including but not limited  to vaginal bleeding, contractions, leaking of fluid and fetal movement were reviewed in detail with the patient. Please refer to After Visit Summary for other counseling recommendations.  Return in about 1 week (around 01/14/2022).   Hermina Staggers, MD

## 2022-01-07 NOTE — Progress Notes (Signed)
Pt has questions about hospital stay.

## 2022-01-07 NOTE — Patient Instructions (Signed)
Cesarean Delivery, Care After The following information offers guidance on how to care for yourself after your procedure. Your health care provider may also give you more specific instructions. If you have problems or questions, contact your health care provider. What can I expect after the procedure? After the procedure, it is common to have: A small amount of blood or clear fluid coming from the incision. Some redness, swelling, and pain in your incision area. Some abdominal pain and soreness. Vaginal bleeding (lochia). Even though you did not have a vaginal delivery, you will still have vaginal bleeding and discharge. Pelvic cramps. Fatigue. You may have pain, swelling, and discomfort in the tissue between your vagina and your anus (perineum) if: Your C-section was unplanned, and you were allowed to labor and push. An incision was made in the area (episiotomy) or the tissue tore during attempted vaginal delivery. Follow these instructions at home: Medicines Take over-the-counter and prescription medicines only as told by your health care provider. If you were prescribed an antibiotic medicine, take it as told by your health care provider. Do not stop taking the antibiotic even if you start to feel better. Ask your health care provider if the medicine prescribed to you requires you to avoid driving or using machinery. Incision care  Follow instructions from your health care provider about how to take care of your incision. Make sure you: Wash your hands with soap and water for an least 20 seconds before and after you change your bandage (dressing). If soap and water are not available, use hand sanitizer. If you have a dressing, change it or remove it as told by your health care provider. Leave stitches (sutures), skin staples, skin glue, or adhesive strips in place. These skin closures may need to stay in place for 2 weeks or longer. If adhesive strip edges start to loosen and curl up, you  may trim the loose edges. Do not remove adhesive strips completely unless your health care provider tells you to do that. Check your incision area every day for signs of infection. Check for: More redness, swelling, or pain. More fluid or blood. Warmth. Pus or a bad smell. Do not take baths, swim, or use a hot tub until your health care provider approves. Ask your health care provider if you may take showers. When you cough or sneeze, hug a pillow. This helps with pain and decreases the chance of your incision opening up (dehiscing). Do this until your incision heals. Managing constipation Your procedure may cause constipation. To prevent or treat constipation, you may need to: Drink enough fluid to keep your urine pale yellow. Take over-the-counter or prescription medicines. Eat foods that are high in fiber, such as beans, whole grains, and fresh fruits and vegetables. Limit foods that are high in fat and processed sugars, such as fried or sweet foods. Activity  If possible, have someone help you care for your baby and help with household activities for at least a few days after you leave the hospital. Rest as much as possible. Try to rest or take a nap while your baby is sleeping. You may have to avoid lifting. Ask your health care provider how much you can safely lift. Return to your normal activities as told by your health care provider. Ask your health care provider what activities are safe for you. Talk with your health care provider about when you can engage in sexual activity. This may depend on your: Risk of infection. How fast you heal. Comfort   and desire to engage in sexual activity. Lifestyle Do not drink alcohol. This is especially important if you are breastfeeding or taking pain medicine. Do not use any products that contain nicotine or tobacco. These products include cigarettes, chewing tobacco, and vaping devices, such as e-cigarettes. If you need help quitting, ask your  health care provider. General instructions Do not use tampons or douches until your health care provider approves. Wear loose, comfortable clothing and a supportive and well-fitting bra. If you pass a blood clot, save it and call your health care provider to discuss. Do not flush blood clots down the toilet before you get instructions from your health care provider. Keep all follow-up visits for you and your baby. This is important. Contact a health care provider if: You have: A fever. Dizziness or light-headedness. Bad-smelling vaginal discharge. A blood clot pass from your vagina. Pus, blood, or a bad smell coming from your incision. An incision that feels warm to the touch. More redness, swelling, or pain around your incision. Difficulty or pain when urinating. Nausea or vomiting. Little or no interest in activities you used to enjoy. Your breasts turn red or become painful or hard. You feel unusually sad or worried. You have questions about caring for yourself or your baby. You have redness, swelling, and pain in an arm or leg. Get help right away if: You have: Pain that does not go away or get better with medicine. Chest pain. Trouble breathing. Blurred vision, spots, or flashing lights in your vision. Thoughts about hurting yourself or your baby. New pain in your abdomen or in one of your legs. A severe headache that does not get better with pain medicine. You faint. You bleed from your vagina so much that you fill more than one sanitary pad in one hour. Bleeding should not be heavier than your heaviest period. These symptoms may be an emergency. Get help right away. Call 911. Do not wait to see if the symptoms will go away. Do not drive yourself to the hospital. Get help right away if you feel like you may hurt yourself or others, or have thoughts about taking your own life. Go to your nearest emergency room or: Call 911. Call the National Suicide Prevention Lifeline at  1-800-273-8255 or 988. This is open 24 hours a day. Text the Crisis Text Line at 741741. Summary After the procedure, it is common to have pain at your incision site, abdominal cramping, and slight bleeding from your vagina. Check your incision area every day for signs of infection. Tell your health care provider about any unusual symptoms. Keep all follow-up visits for you and your baby. This is important. This information is not intended to replace advice given to you by your health care provider. Make sure you discuss any questions you have with your health care provider. Document Revised: 11/27/2020 Document Reviewed: 11/27/2020 Elsevier Patient Education  2023 Elsevier Inc.  

## 2022-01-08 ENCOUNTER — Encounter (HOSPITAL_COMMUNITY): Payer: Self-pay | Admitting: Obstetrics and Gynecology

## 2022-01-08 ENCOUNTER — Inpatient Hospital Stay (HOSPITAL_COMMUNITY): Payer: Medicaid Other | Admitting: Anesthesiology

## 2022-01-08 ENCOUNTER — Other Ambulatory Visit: Payer: Self-pay

## 2022-01-08 ENCOUNTER — Inpatient Hospital Stay (HOSPITAL_COMMUNITY)
Admission: RE | Admit: 2022-01-08 | Discharge: 2022-01-10 | DRG: 785 | Disposition: A | Discharge status: Home / Self Care | Payer: Medicaid Other | Attending: Obstetrics and Gynecology | Admitting: Obstetrics and Gynecology

## 2022-01-08 ENCOUNTER — Encounter (HOSPITAL_COMMUNITY)
Admission: RE | Disposition: A | Discharge status: Home / Self Care | Payer: Self-pay | Source: Home / Self Care | Attending: Obstetrics and Gynecology

## 2022-01-08 DIAGNOSIS — O99214 Obesity complicating childbirth: Secondary | ICD-10-CM | POA: Diagnosis present

## 2022-01-08 DIAGNOSIS — O24429 Gestational diabetes mellitus in childbirth, unspecified control: Secondary | ICD-10-CM

## 2022-01-08 DIAGNOSIS — Z302 Encounter for sterilization: Secondary | ICD-10-CM

## 2022-01-08 DIAGNOSIS — O34211 Maternal care for low transverse scar from previous cesarean delivery: Secondary | ICD-10-CM

## 2022-01-08 DIAGNOSIS — Z3A37 37 weeks gestation of pregnancy: Secondary | ICD-10-CM

## 2022-01-08 DIAGNOSIS — F319 Bipolar disorder, unspecified: Secondary | ICD-10-CM | POA: Diagnosis present

## 2022-01-08 DIAGNOSIS — O0990 Supervision of high risk pregnancy, unspecified, unspecified trimester: Secondary | ICD-10-CM

## 2022-01-08 DIAGNOSIS — O9902 Anemia complicating childbirth: Secondary | ICD-10-CM | POA: Diagnosis present

## 2022-01-08 DIAGNOSIS — O24419 Gestational diabetes mellitus in pregnancy, unspecified control: Secondary | ICD-10-CM | POA: Diagnosis present

## 2022-01-08 DIAGNOSIS — O24425 Gestational diabetes mellitus in childbirth, controlled by oral hypoglycemic drugs: Secondary | ICD-10-CM | POA: Diagnosis present

## 2022-01-08 DIAGNOSIS — O3663X1 Maternal care for excessive fetal growth, third trimester, fetus 1: Secondary | ICD-10-CM | POA: Diagnosis not present

## 2022-01-08 DIAGNOSIS — Z98891 History of uterine scar from previous surgery: Principal | ICD-10-CM

## 2022-01-08 LAB — GLUCOSE, CAPILLARY
Glucose-Capillary: 90 mg/dL (ref 70–99)
Glucose-Capillary: 103 mg/dL — ABNORMAL HIGH (ref 70–99)

## 2022-01-08 SURGERY — Surgical Case
Anesthesia: Spinal

## 2022-01-08 MED ORDER — ACETAMINOPHEN 10 MG/ML IV SOLN
INTRAVENOUS | Status: DC | PRN
  Administered 2022-01-08: 1000 mg via INTRAVENOUS

## 2022-01-08 MED ORDER — NALOXONE HCL 4 MG/10ML IJ SOLN: 1.0000 ug/kg/h | INTRAVENOUS | Status: DC | PRN

## 2022-01-08 MED ORDER — TRANEXAMIC ACID-NACL 1000-0.7 MG/100ML-% IV SOLN
INTRAVENOUS | Status: DC | PRN
  Administered 2022-01-08: 1000 mg via INTRAVENOUS

## 2022-01-08 MED ORDER — OXYTOCIN-SODIUM CHLORIDE 30-0.9 UT/500ML-% IV SOLN
INTRAVENOUS | Status: AC
  Filled 2022-01-08: qty 500

## 2022-01-08 MED ORDER — TRANEXAMIC ACID-NACL 1000-0.7 MG/100ML-% IV SOLN
INTRAVENOUS | Status: AC
  Filled 2022-01-08: qty 100

## 2022-01-08 MED ORDER — WITCH HAZEL-GLYCERIN EX PADS: 1.0000 | MEDICATED_PAD | CUTANEOUS | Status: DC | PRN

## 2022-01-08 MED ORDER — ZOLPIDEM TARTRATE 5 MG PO TABS: 5.0000 mg | ORAL_TABLET | Freq: Every evening | ORAL | Status: DC | PRN

## 2022-01-08 MED ORDER — SIMETHICONE 80 MG PO CHEW: 80.0000 mg | CHEWABLE_TABLET | ORAL | Status: DC | PRN

## 2022-01-08 MED ORDER — SENNOSIDES-DOCUSATE SODIUM 8.6-50 MG PO TABS
2.0000 | ORAL_TABLET | ORAL | Status: DC
  Administered 2022-01-09: 2 via ORAL
  Filled 2022-01-08: qty 2

## 2022-01-08 MED ORDER — BUPIVACAINE IN DEXTROSE 0.75-8.25 % IT SOLN
INTRATHECAL | Status: DC | PRN
  Administered 2022-01-08: 12.75 mg via INTRATHECAL

## 2022-01-08 MED ORDER — CEFAZOLIN IN SODIUM CHLORIDE 3-0.9 GM/100ML-% IV SOLN
3.0000 g | INTRAVENOUS | Status: AC
  Administered 2022-01-08: 3 g via INTRAVENOUS

## 2022-01-08 MED ORDER — ACETAMINOPHEN 10 MG/ML IV SOLN
INTRAVENOUS | Status: AC
  Filled 2022-01-08: qty 100

## 2022-01-08 MED ORDER — METOCLOPRAMIDE HCL 5 MG/ML IJ SOLN
INTRAMUSCULAR | Status: DC | PRN
  Administered 2022-01-08: 10 mg via INTRAVENOUS

## 2022-01-08 MED ORDER — SCOPOLAMINE 1 MG/3DAYS TD PT72
MEDICATED_PATCH | TRANSDERMAL | Status: AC
  Filled 2022-01-08: qty 1

## 2022-01-08 MED ORDER — PHENYLEPHRINE HCL-NACL 20-0.9 MG/250ML-% IV SOLN
INTRAVENOUS | Status: DC | PRN
  Administered 2022-01-08: 60 ug/min via INTRAVENOUS

## 2022-01-08 MED ORDER — FENTANYL CITRATE (PF) 100 MCG/2ML IJ SOLN
INTRAMUSCULAR | Status: DC | PRN
Start: 2022-01-08 — End: 2022-01-08
  Administered 2022-01-08: 15 ug via INTRATHECAL

## 2022-01-08 MED ORDER — POVIDONE-IODINE 10 % EX SWAB
2.0000 | Freq: Once | CUTANEOUS | Status: AC
  Administered 2022-01-08: 2 via TOPICAL

## 2022-01-08 MED ORDER — DIBUCAINE (PERIANAL) 1 % EX OINT: 1.0000 | TOPICAL_OINTMENT | CUTANEOUS | Status: DC | PRN

## 2022-01-08 MED ORDER — OXYCODONE HCL 5 MG PO TABS
5.0000 mg | ORAL_TABLET | ORAL | Status: DC | PRN
  Administered 2022-01-09: 5 mg via ORAL
  Filled 2022-01-08: qty 1

## 2022-01-08 MED ORDER — MEASLES, MUMPS & RUBELLA VAC IJ SOLR: 0.5000 mL | Freq: Once | INTRAMUSCULAR | Status: DC

## 2022-01-08 MED ORDER — IBUPROFEN 600 MG PO TABS
600.0000 mg | ORAL_TABLET | Freq: Four times a day (QID) | ORAL | Status: DC
  Administered 2022-01-08 – 2022-01-10 (×7): 600 mg via ORAL
  Filled 2022-01-08 (×7): qty 1

## 2022-01-08 MED ORDER — ONDANSETRON HCL 4 MG/2ML IJ SOLN
INTRAMUSCULAR | Status: DC | PRN
  Administered 2022-01-08: 4 mg via INTRAVENOUS

## 2022-01-08 MED ORDER — SIMETHICONE 80 MG PO CHEW
80.0000 mg | CHEWABLE_TABLET | Freq: Three times a day (TID) | ORAL | Status: DC
  Administered 2022-01-09 – 2022-01-10 (×5): 80 mg via ORAL
  Filled 2022-01-08 (×5): qty 1

## 2022-01-08 MED ORDER — NALOXONE HCL 0.4 MG/ML IJ SOLN: 0.4000 mg | INTRAMUSCULAR | Status: DC | PRN

## 2022-01-08 MED ORDER — HYDROMORPHONE HCL 1 MG/ML IJ SOLN: 0.2500 mg | INTRAMUSCULAR | Status: DC | PRN

## 2022-01-08 MED ORDER — SODIUM CHLORIDE 0.9% FLUSH
3.0000 mL | INTRAVENOUS | Status: DC | PRN
Start: 2022-01-08 — End: 2022-01-10

## 2022-01-08 MED ORDER — SCOPOLAMINE 1 MG/3DAYS TD PT72
1.0000 | MEDICATED_PATCH | Freq: Once | TRANSDERMAL | Status: DC
  Administered 2022-01-08: 1.5 mg via TRANSDERMAL

## 2022-01-08 MED ORDER — PHENYLEPHRINE HCL (PRESSORS) 10 MG/ML IV SOLN
INTRAVENOUS | Status: DC | PRN
  Administered 2022-01-08: 80 ug via INTRAVENOUS
  Administered 2022-01-08: 160 ug via INTRAVENOUS
  Administered 2022-01-08: 240 ug via INTRAVENOUS

## 2022-01-08 MED ORDER — COCONUT OIL OIL: 1.0000 | TOPICAL_OIL | Status: DC | PRN

## 2022-01-08 MED ORDER — EPHEDRINE 5 MG/ML INJ
INTRAVENOUS | Status: AC
  Filled 2022-01-08: qty 5

## 2022-01-08 MED ORDER — CEFAZOLIN IN SODIUM CHLORIDE 3-0.9 GM/100ML-% IV SOLN
INTRAVENOUS | Status: AC
  Filled 2022-01-08: qty 100

## 2022-01-08 MED ORDER — MORPHINE SULFATE (PF) 0.5 MG/ML IJ SOLN
INTRAMUSCULAR | Status: DC | PRN
  Administered 2022-01-08: 150 ug via INTRATHECAL

## 2022-01-08 MED ORDER — OXYCODONE HCL 5 MG PO TABS: 5.0000 mg | ORAL_TABLET | Freq: Once | ORAL | Status: DC | PRN

## 2022-01-08 MED ORDER — MENTHOL 3 MG MT LOZG: 1.0000 | LOZENGE | OROMUCOSAL | Status: DC | PRN

## 2022-01-08 MED ORDER — PRENATAL MULTIVITAMIN CH
1.0000 | ORAL_TABLET | Freq: Every day | ORAL | Status: DC
  Administered 2022-01-09 – 2022-01-10 (×2): 1 via ORAL
  Filled 2022-01-08 (×2): qty 1

## 2022-01-08 MED ORDER — SODIUM CHLORIDE 0.9 % IV SOLN
INTRAVENOUS | Status: AC | PRN
  Administered 2022-01-08: 1000 mL via INTRAMUSCULAR

## 2022-01-08 MED ORDER — ONDANSETRON HCL 4 MG/2ML IJ SOLN: 4.0000 mg | Freq: Three times a day (TID) | INTRAMUSCULAR | Status: DC | PRN

## 2022-01-08 MED ORDER — OXYCODONE HCL 5 MG/5ML PO SOLN: 5.0000 mg | Freq: Once | ORAL | Status: DC | PRN

## 2022-01-08 MED ORDER — DEXAMETHASONE SODIUM PHOSPHATE 4 MG/ML IJ SOLN
INTRAMUSCULAR | Status: AC
  Filled 2022-01-08: qty 2

## 2022-01-08 MED ORDER — FENTANYL CITRATE (PF) 100 MCG/2ML IJ SOLN
INTRAMUSCULAR | Status: AC
  Filled 2022-01-08: qty 2

## 2022-01-08 MED ORDER — ACETAMINOPHEN 500 MG PO TABS
1000.0000 mg | ORAL_TABLET | Freq: Four times a day (QID) | ORAL | Status: DC
  Administered 2022-01-08 – 2022-01-10 (×7): 1000 mg via ORAL
  Filled 2022-01-08 (×7): qty 2

## 2022-01-08 MED ORDER — ONDANSETRON HCL 4 MG/2ML IJ SOLN
INTRAMUSCULAR | Status: AC
  Filled 2022-01-08: qty 2

## 2022-01-08 MED ORDER — DEXAMETHASONE SODIUM PHOSPHATE 4 MG/ML IJ SOLN
INTRAMUSCULAR | Status: DC | PRN
  Administered 2022-01-08: 4 mg via INTRAVENOUS

## 2022-01-08 MED ORDER — LACTATED RINGERS IV SOLN: INTRAVENOUS | Status: DC

## 2022-01-08 MED ORDER — DIPHENHYDRAMINE HCL 25 MG PO CAPS
25.0000 mg | ORAL_CAPSULE | Freq: Four times a day (QID) | ORAL | Status: DC | PRN
  Administered 2022-01-08: 25 mg via ORAL

## 2022-01-08 MED ORDER — KETOROLAC TROMETHAMINE 30 MG/ML IJ SOLN
INTRAMUSCULAR | Status: AC
  Filled 2022-01-08: qty 1

## 2022-01-08 MED ORDER — DIPHENHYDRAMINE HCL 50 MG/ML IJ SOLN
INTRAMUSCULAR | Status: AC
  Filled 2022-01-08: qty 1

## 2022-01-08 MED ORDER — OXYTOCIN-SODIUM CHLORIDE 30-0.9 UT/500ML-% IV SOLN
INTRAVENOUS | Status: DC | PRN
  Administered 2022-01-08: 500 mL via INTRAVENOUS

## 2022-01-08 MED ORDER — DIPHENHYDRAMINE HCL 50 MG/ML IJ SOLN
12.5000 mg | INTRAMUSCULAR | Status: DC | PRN
  Administered 2022-01-08: 12.5 mg via INTRAVENOUS

## 2022-01-08 MED ORDER — STERILE WATER FOR IRRIGATION IR SOLN
Status: DC | PRN
  Administered 2022-01-08: 1000 mL

## 2022-01-08 MED ORDER — ONDANSETRON HCL 4 MG/2ML IJ SOLN: 4.0000 mg | Freq: Once | INTRAMUSCULAR | Status: DC | PRN

## 2022-01-08 MED ORDER — EPHEDRINE SULFATE-NACL 50-0.9 MG/10ML-% IV SOSY
PREFILLED_SYRINGE | INTRAVENOUS | Status: DC | PRN
  Administered 2022-01-08: 10 mg via INTRAVENOUS
  Administered 2022-01-08 (×2): 5 mg via INTRAVENOUS

## 2022-01-08 MED ORDER — OXYTOCIN-SODIUM CHLORIDE 30-0.9 UT/500ML-% IV SOLN: 2.5000 [IU]/h | INTRAVENOUS | Status: AC

## 2022-01-08 MED ORDER — PHENYLEPHRINE 80 MCG/ML (10ML) SYRINGE FOR IV PUSH (FOR BLOOD PRESSURE SUPPORT)
PREFILLED_SYRINGE | INTRAVENOUS | Status: AC
  Filled 2022-01-08: qty 10

## 2022-01-08 MED ORDER — MEPERIDINE HCL 25 MG/ML IJ SOLN: 6.2500 mg | INTRAMUSCULAR | Status: DC | PRN

## 2022-01-08 MED ORDER — KETOROLAC TROMETHAMINE 30 MG/ML IJ SOLN
30.0000 mg | Freq: Once | INTRAMUSCULAR | Status: AC | PRN
  Administered 2022-01-08: 30 mg via INTRAVENOUS

## 2022-01-08 MED ORDER — METOCLOPRAMIDE HCL 5 MG/ML IJ SOLN
INTRAMUSCULAR | Status: AC
  Filled 2022-01-08: qty 2

## 2022-01-08 MED ORDER — PHENYLEPHRINE HCL-NACL 20-0.9 MG/250ML-% IV SOLN
INTRAVENOUS | Status: AC
  Filled 2022-01-08: qty 250

## 2022-01-08 MED ORDER — MORPHINE SULFATE (PF) 0.5 MG/ML IJ SOLN
INTRAMUSCULAR | Status: AC
  Filled 2022-01-08: qty 10

## 2022-01-08 MED ORDER — ENOXAPARIN SODIUM 60 MG/0.6ML IJ SOSY
60.0000 mg | PREFILLED_SYRINGE | INTRAMUSCULAR | Status: DC
  Administered 2022-01-09 – 2022-01-10 (×2): 60 mg via SUBCUTANEOUS
  Filled 2022-01-08 (×2): qty 0.6

## 2022-01-08 MED ORDER — DIPHENHYDRAMINE HCL 25 MG PO CAPS
25.0000 mg | ORAL_CAPSULE | ORAL | Status: DC | PRN
  Filled 2022-01-08: qty 1

## 2022-01-08 SURGICAL SUPPLY — 37 items
APL SKNCLS STERI-STRIP NONHPOA (GAUZE/BANDAGES/DRESSINGS) ×1
BENZOIN TINCTURE PRP APPL 2/3 (GAUZE/BANDAGES/DRESSINGS) ×2 IMPLANT
CANISTER WOUND CARE 500ML ATS (WOUND CARE) IMPLANT
CHLORAPREP W/TINT 26ML (MISCELLANEOUS) ×4 IMPLANT
CLAMP CORD UMBIL (MISCELLANEOUS) ×1 IMPLANT
CLOTH BEACON ORANGE TIMEOUT ST (SAFETY) ×1 IMPLANT
DRESSING PREVENA PLUS CUSTOM (GAUZE/BANDAGES/DRESSINGS) IMPLANT
DRSG OPSITE POSTOP 4X10 (GAUZE/BANDAGES/DRESSINGS) ×2 IMPLANT
DRSG PREVENA PLUS CUSTOM (GAUZE/BANDAGES/DRESSINGS) ×1
ELECT REM PT RETURN 9FT ADLT (ELECTROSURGICAL) ×1
ELECTRODE REM PT RTRN 9FT ADLT (ELECTROSURGICAL) ×1 IMPLANT
EXTRACTOR VACUUM M CUP 4 TUBE (SUCTIONS) IMPLANT
GLOVE BIOGEL PI IND STRL 7.0 (GLOVE) ×4 IMPLANT
GLOVE BIOGEL PI IND STRL 7.5 (GLOVE) ×4 IMPLANT
GLOVE BIOGEL PI INDICATOR 7.0 (GLOVE) ×2
GLOVE BIOGEL PI INDICATOR 7.5 (GLOVE) ×2
GLOVE ECLIPSE 7.5 STRL STRAW (GLOVE) ×1 IMPLANT
GOWN STRL REUS W/TWL LRG LVL3 (GOWN DISPOSABLE) ×3 IMPLANT
HEMOSTAT ARISTA ABSORB 3G PWDR (HEMOSTASIS) IMPLANT
KIT ABG SYR 3ML LUER SLIP (SYRINGE) IMPLANT
NDL HYPO 25X5/8 SAFETYGLIDE (NEEDLE) IMPLANT
NEEDLE HYPO 25X5/8 SAFETYGLIDE (NEEDLE) IMPLANT
NS IRRIG 1000ML POUR BTL (IV SOLUTION) ×1 IMPLANT
PACK C SECTION WH (CUSTOM PROCEDURE TRAY) ×2 IMPLANT
PAD OB MATERNITY 4.3X12.25 (PERSONAL CARE ITEMS) ×1 IMPLANT
RTRCTR C-SECT PINK 25CM LRG (MISCELLANEOUS) ×1 IMPLANT
STRIP CLOSURE SKIN 1/2X4 (GAUZE/BANDAGES/DRESSINGS) ×1 IMPLANT
SUT PLAIN 0 NONE (SUTURE) ×1 IMPLANT
SUT VIC AB 0 CT1 36 (SUTURE) ×3 IMPLANT
SUT VIC AB 2-0 CT1 (SUTURE) IMPLANT
SUT VIC AB 2-0 CT1 27 (SUTURE) ×1
SUT VIC AB 2-0 CT1 TAPERPNT 27 (SUTURE) ×2 IMPLANT
SUT VIC AB 4-0 KS 27 (SUTURE) ×1 IMPLANT
TOWEL OR 17X24 6PK STRL BLUE (TOWEL DISPOSABLE) ×1 IMPLANT
TRAY FOLEY W/BAG SLVR 14FR LF (SET/KITS/TRAYS/PACK) ×1 IMPLANT
WATER STERILE IRR 1000ML POUR (IV SOLUTION) ×1 IMPLANT
YANKAUER SUCT BULB TIP NO VENT (SUCTIONS) IMPLANT

## 2022-01-08 NOTE — Anesthesia Postprocedure Evaluation (Signed)
Anesthesia Post Note  Patient: Jean Dougherty  Procedure(s) Performed: CESAREAN SECTION CESAREAN SECTION WITH BILATERAL TUBAL LIGATION     Patient location during evaluation: Mother Baby Anesthesia Type: Spinal Level of consciousness: oriented and awake and alert Pain management: pain level controlled Vital Signs Assessment: post-procedure vital signs reviewed and stable Respiratory status: spontaneous breathing and respiratory function stable Cardiovascular status: blood pressure returned to baseline and stable Postop Assessment: no headache, no backache, no apparent nausea or vomiting and able to ambulate Anesthetic complications: no   No notable events documented.  Last Vitals:  Vitals:   01/08/22 1436 01/08/22 1516  BP: 124/66 127/77  Pulse: 93 92  Resp: (!) 24 20  Temp:  37.1 C  SpO2: 98% 98%    Last Pain:  Vitals:   01/08/22 1516  TempSrc: Axillary  PainSc:    Pain Goal: Patients Stated Pain Goal: 7 (01/08/22 1349)                 Trevor Iha

## 2022-01-08 NOTE — H&P (Signed)
LABOR AND DELIVERY ADMISSION HISTORY AND PHYSICAL NOTE  Jean Dougherty is a 31 y.o. female [phone] with IUP at 65w1dby L/12 presenting for scheduled repeat cesarean section.   She reports positive fetal movement. She denies leakage of fluid or vaginal bleeding.  Prenatal History/Complications:  Past Medical History: Past Medical History:  Diagnosis Date   Asthma    Bipolar disorder ([place]    Failed attempted termination of pregnancy without complication 173/53/2992  Patient took mifepristone/misoprostol at 8 weeks for medical abortion which failed, was advised to have surgical termination which she declined. Desires to continue pregnancy, counseled in [facility] 02/10/18 about potential fetal anomalies/malformations from prostaglandin exposure.   Impaired glucose in pregnancy, antepartum 09/15/2015   1 hour 137, pt missed appt for 3 hour GTT, prenatal appt in February with next attended visit in April at 382 weeks    Marijuana use    Seasonal allergies 04/27/2016    Past Surgical History: Past Surgical History:  Procedure Laterality Date   CESAREAN SECTION  03/09/2012   Procedure: CESAREAN SECTION;  Surgeon: LLahoma Crocker MD;  [facility];  Service: Obstetrics;  Laterality: N/A;   WISDOM TOOTH EXTRACTION      Obstetrical History: OB History     Gravida  4   Para  2   Term  2   Preterm  0   AB  1   Living  2      SAB  1   IAB  0   Ectopic  0   Multiple  0   Live Births  2           Social History: Social History   Socioeconomic History   Marital status: Single    Spouse name: Not on file   Number of children: Not on file   Years of education: Not on file   Highest education level: Not on file  Occupational History   Occupation: CNA  Tobacco Use   Smoking status: Never   Smokeless tobacco: Never  Vaping Use   Vaping Use: Never used  Substance and Sexual Activity   Alcohol use: No   Drug use: Yes    Types: Marijuana    Comment: last  smoked December 2022   Sexual activity: Yes    Birth control/protection: None  Other Topics Concern   Not on file  Social History Narrative   Works as a [employer] manager Has two children 759 monthsold and 437years old    Social Determinants of Health   Financial Resource Strain: Low Risk  (02/11/2018)   Overall Financial Resource Strain (CARDIA)    Difficulty of Paying Living Expenses: Not hard at all  Food Insecurity: No Food Insecurity (02/11/2018)   Hunger Vital Sign    Worried About Running Out of Food in the Last Year: Never true    Ran Out of Food in the Last Year: Never true  Transportation Needs: No Transportation Needs (02/11/2018)   PRAPARE - THydrologist(Medical): No    Lack of Transportation (Non-Medical): No  Physical Activity: Not on file  Stress: Not on file  Social Connections: Not on file    Family History: Family History  Problem Relation Age of Onset   Hypertension Father    Diabetes Father    Hypertension Maternal Uncle    Cancer Maternal Grandmother        lung cancer    Hypertension Maternal Grandfather    Diabetes Maternal  Grandfather    Other Neg Hx     Allergies: No Known Allergies  Medications Prior to Admission  Medication Sig Dispense Refill Last Dose   aspirin EC 81 MG tablet Take 1 tablet (81 mg total) by mouth daily. Take after 12 weeks for prevention of preeclampsia later in pregnancy 300 tablet 2    famotidine (PEPCID) 20 MG tablet Take 1 tablet (20 mg total) by mouth daily. 30 tablet 1    ferrous sulfate 325 (65 FE) MG EC tablet Take 1 tablet (325 mg total) by mouth every other day. 45 tablet 1    Magnesium 100 MG TABS Take 100 mg by mouth every morning.      metFORMIN (GLUCOPHAGE) 500 MG tablet Take 2 tablets (1,000 mg total) by mouth 2 (two) times daily with a meal. (Patient taking differently: Take 500 mg by mouth 2 (two) times daily with a meal.) 60 tablet 5    pantoprazole (PROTONIX) 40 MG tablet Take 1 tablet (40 mg  total) by mouth daily. 30 tablet 0    Prenatal Vit-Fe Fumarate-FA (MULTIVITAMIN-PRENATAL) 27-0.8 MG TABS tablet Take 1 tablet by mouth daily at 12 noon.      promethazine (PHENERGAN) 25 MG tablet Take 1 tablet (25 mg total) by mouth every 6 (six) hours as needed for nausea or vomiting. (Patient taking differently: Take 25 mg by mouth daily.) 90 tablet 1    Accu-Chek Softclix Lancets lancets 1 each by Other route 4 (four) times daily. Check BS AM Fasting and 2 hours after each meal. 100 each 12    Blood Glucose Monitoring Suppl (ACCU-CHEK GUIDE ME) w/Device KIT 1 Device by Does not apply route 4 (four) times daily. Use with test strips to monitor Blood Sugar 1 kit 0    glucose blood (ACCU-CHEK GUIDE) test strip Check BS AM fasting and 2 hours after each meal. (Patient not taking: Reported on 11/13/2021) 100 each 12      Review of Systems   All systems reviewed and negative except as stated in HPI  Blood pressure 112/63, pulse 80, temperature 98.5 F (36.9 C), temperature source Oral, resp. rate 16, height 5' 4" (1.626 m), weight 133.8 kg, last menstrual period 04/23/2021, SpO2 99 %, unknown if currently breastfeeding. General appearance: alert, cooperative, and appears stated age Lungs: clear to auscultation bilaterally Heart: regular rate and rhythm Abdomen: soft, non-tender; bowel sounds normal Extremities: No calf swelling or tenderness Presentation: cephalic Fetal monitoring: 130s Uterine activity: quiet     Prenatal labs: ABO, Rh: --/--/A POS (08/30 1034) Antibody: NEG (08/30 1034) Rubella: Immune (03/08 0000) RPR: NON REACTIVE (08/30 1030)  HBsAg: Negative (03/08 0000)  HIV: Non Reactive (06/30 1056)  GBS: Negative/-- (08/23 1016)  2 hr Glucola: abnormal Genetic screening:  normal Anatomy US: normal  Prenatal Transfer Tool  Maternal Diabetes: Yes:  Diabetes Type:  Insulin/Medication controlled Genetic Screening: Normal Maternal Ultrasounds/Referrals: Normal Fetal  Ultrasounds or other Referrals:  None Maternal Substance Abuse:  No Significant Maternal Medications:  Meds include: Other: metformin Significant Maternal Lab Results: Group B Strep negative  Results for orders placed or performed during the hospital encounter of 01/08/22 (from the past 24 hour(s))  Glucose, capillary   Collection Time: 01/08/22  9:30 AM  Result Value Ref Range   Glucose-Capillary 90 70 - 99 mg/dL    Patient Active Problem List   Diagnosis Date Noted   History of cesarean section 01/07/2022   GDM (gestational diabetes mellitus) 11/10/2021   Supervision of high-risk  pregnancy 09/08/2021   Bipolar disorder ([place]) 04/27/2016   Morbid obesity ([place]) 09/14/2015   History of VBAC 09/14/2015    Assessment: Jean Dougherty is a 31 y.o. [id number] at 43w1dhere for scheduled repeat cesarean section.   Patient has a history of one successful vbac of a 3500 g infant. Extrapolated EFW for current fetus is 3900 g. I shared with patient that this is well below the threshold at which we would advise consideration of c/s for macrosomia, and that given her history of successful vbac, she is a good candidate. We did also discuss the relative risks and benefits of VBAC versus surgery. We engaged in lengthy discussion and all patient questions were answered. She elects to proceed with repeat cesarean section.  The risks of cesarean section were discussed with the patient including but were not limited to: bleeding which may require transfusion or reoperation; infection which may require antibiotics; injury to bowel, bladder, ureters or other surrounding organs; injury to the fetus; need for additional procedures including hysterectomy in the event of a life-threatening hemorrhage; placental abnormalities wth subsequent pregnancies, incisional problems, thromboembolic phenomenon and other postoperative/anesthesia complications. Patient has been NPO since midnight and she will remain NPO for  procedure. Anesthesia and OR aware.  Preoperative prophylactic antibiotics and SCDs ordered on call to the OR.  To OR when ready.  # Bipolar: not on meds, doesn't have a therapist, says symptoms controlled  # A2GDM: on metformin, fastings and post-prandials are above goal, fetus also macrosomic; uncontrolled A2GDM is the indication for delivery at this gestational age. Will plan on postpartum fasting glucose. Fasting glucose this morning is 90.  # Contraception: Patient desires permanent sterilization.  Other reversible forms of contraception were discussed with patient; she declines all other modalities. Risks of procedure discussed with patient including but not limited to: risk of regret, permanence of method, bleeding, infection, injury to surrounding organs and need for additional procedures.  Failure risk of 1-2 % with increased risk of ectopic gestation if pregnancy occurs was also discussed with patient.  Patient verbalized understanding of these risks and wants to proceed with sterilization.  Written informed consent obtained.  Discussed risks/benefits of partial vs whole salpingectomy, patient desires salpingectomy  # Feeding: breast  # Circ: yes, inpatient  NPatsy LagerWDetar North8/31/2023, 10:38 AM

## 2022-01-08 NOTE — Lactation Note (Signed)
This note was copied from a baby's chart. Lactation Consultation Note  Patient Name: Jean Dougherty [id number] Date: 01/08/2022 Reason for consult: Initial assessment;Mother's request;Difficult latch;Early term 37-38.6wks;Maternal endocrine disorder;Breastfeeding assistance Age:31 years  LC assisted with latching infant at the breast with signs of milk transfer.  Infant still feeding at the end of the visit.   Plan 1. To feed based on cues 8-12x 24hr period. Birth parent to offer breasts and look for signs of milk transfer.  2. If unable to latch, birth parent to offer colostrum on a spoon then try a latch.  All questions answered at the end of the visit.  Birth parent has Medela electric pump in the room.   Maternal Data Has patient been taught Hand Expression?: Yes Does the patient have breastfeeding experience prior to this delivery?: Yes How long did the patient breastfeed?: 2 children 6 and 7 months  Feeding Mother's Current Feeding Choice: Breast Milk  LATCH Score Latch: Repeated attempts needed to sustain latch, nipple held in mouth throughout feeding, stimulation needed to elicit sucking reflex.  Audible Swallowing: Spontaneous and intermittent  Type of Nipple: Everted at rest and after stimulation  Comfort (Breast/Nipple): Soft / non-tender  Hold (Positioning): Assistance needed to correctly position infant at breast and maintain latch.  LATCH Score: 8   Lactation Tools Discussed/Used    Interventions Interventions: Breast feeding basics reviewed;Assisted with latch;Skin to skin;Breast massage;Hand express;Breast compression;Adjust position;Support pillows;Position options;Expressed milk;Education;LC Psychologist, educational;Visual merchandiser education  Discharge WIC Program: Yes  Consult Status Consult Status: Follow-up Date: 01/09/22 Follow-up type: In-patient    Jean Dougherty  Jean Dougherty 01/08/2022, 5:18 PM

## 2022-01-08 NOTE — Discharge Summary (Signed)
Postpartum Discharge Summary  Date of Service updated- yes     Patient Name: Jean Dougherty DOB: 05-Dec-1990 MRN: [id number]  Date of admission: 01/08/2022 Delivery date:01/08/2022  Delivering provider: Laurey Arrow [facility]  Date of discharge: 01/10/2022  Admitting diagnosis: Status post repeat low transverse cesarean section [Z98.891] Intrauterine pregnancy: [redacted]w[redacted]d     Secondary diagnosis:  Principal Problem:   Status post repeat low transverse cesarean section Active Problems:   Delivery of pregnancy by cesarean section   Morbid obesity ([place])   History of VBAC   Bipolar disorder ([place])   Supervision of high-risk pregnancy   GDM (gestational diabetes mellitus)   History of cesarean section  Additional problems: none    Discharge diagnosis: Term Pregnancy Delivered and GDM A2                                              Post partum procedures: none Augmentation: N/A Complications: None  Hospital course: Sceduled C/S   31 y.o. yo [phone] at [redacted]w[redacted]d was admitted to the hospital 01/08/2022 for scheduled cesarean section with the following indication:Elective Repeat.Delivery details are as follows:  Membrane Rupture Time/Date: 12:10 PM ,01/08/2022   Delivery Method:C-Section, Low Transverse  Details of operation can be found in separate operative note.  Patient had an uncomplicated postpartum course.  She is ambulating, tolerating a regular diet, passing flatus, and urinating well. Patient is discharged home in stable condition on  01/10/22        Newborn Data: Birth date:01/08/2022  Birth time:12:11 PM  Gender:Female  Living status:Living  Apgars:9 ,10  Weight:3770 g     Magnesium Sulfate received: No BMZ received: No Rhophylac:No MMR:N/A, Immune T-DaP: declined prenatally offer postpartum Flu: N/A Transfusion:No  Physical exam  Vitals:   01/09/22 0430 01/09/22 1359 01/09/22 2255 01/10/22 0525  BP: (!) 107/55 96/76 (!) 108/50 128/60  Pulse: 95 66 89 90  Resp: $Remo'16 18 16 16   'OKoQe$ Temp: 98.2 F (36.8 C) 98.6 F (37 C) 98.4 F (36.9 C) 98.2 F (36.8 C)  TempSrc: Oral Oral Oral Oral  SpO2: 99% 100% 99% 99%  Weight:      Height:       General: alert, cooperative, and no distress Lochia: appropriate Uterine Fundus: firm Incision: Dressing is clean, dry, and intact, has vacuum device in place DVT Evaluation: No evidence of DVT seen on physical exam. Labs: Lab Results  Component Value Date   WBC 6.4 01/09/2022   HGB 9.7 (L) 01/09/2022   HCT 30.3 (L) 01/09/2022   MCV 83.7 01/09/2022   PLT 238 01/09/2022      Latest Ref Rng & Units 01/09/2022    5:18 AM  CMP  Glucose 70 - 99 mg/dL 103    Edinburgh Score:    01/09/2022    5:54 PM  Edinburgh Postnatal Depression Scale Screening Tool  I have been able to laugh and see the funny side of things. 0  I have looked forward with enjoyment to things. 0  I have blamed myself unnecessarily when things went wrong. 0  I have been anxious or worried for no good reason. 1  I have felt scared or panicky for no good reason. 0  Things have been getting on top of me. 0  I have been so unhappy that I have had difficulty sleeping. 0  I have felt  sad or miserable. 1  I have been so unhappy that I have been crying. 1  The thought of harming myself has occurred to me. 0  Edinburgh Postnatal Depression Scale Total 3     After visit meds:  Allergies as of 01/10/2022   No Known Allergies      Medication List     STOP taking these medications    Accu-Chek Guide Me w/Device Kit   Accu-Chek Guide test strip Generic drug: glucose blood   Accu-Chek Softclix Lancets lancets   aspirin EC 81 MG tablet   famotidine 20 MG tablet Commonly known as: Pepcid   Magnesium 100 MG Tabs   metFORMIN 500 MG tablet Commonly known as: GLUCOPHAGE   pantoprazole 40 MG tablet Commonly known as: Protonix   promethazine 25 MG tablet Commonly known as: PHENERGAN       TAKE these medications    ferrous sulfate 325 (65 FE) MG  EC tablet Take 1 tablet (325 mg total) by mouth every other day.   ibuprofen 600 MG tablet Commonly known as: ADVIL Take 1 tablet (600 mg total) by mouth every 6 (six) hours as needed.   multivitamin-prenatal 27-0.8 MG Tabs tablet Take 1 tablet by mouth daily at 12 noon.   oxyCODONE 5 MG immediate release tablet Commonly known as: Oxy IR/ROXICODONE Take 1 tablet (5 mg total) by mouth every 6 (six) hours as needed for moderate pain or breakthrough pain.         Discharge home in stable condition Infant Feeding: Breast Infant Disposition:home with mother Discharge instruction: per After Visit Summary and Postpartum booklet. Activity: Advance as tolerated. Pelvic rest for 6 weeks.  Diet: carb modified diet Future Appointments: Future Appointments  Date Time Provider [place]  01/15/2022 10:40 AM [place] None  02/19/2022  8:30 AM [facility] None  02/19/2022  8:55 AM Constant, Vickii Chafe, MD [place] None   Follow up Visit:  Message sent 01/09/20  Please schedule this patient for a In person postpartum visit in 6 weeks with the following provider: Any provider. Additional Postpartum F/U:Postpartum Depression checkup, 2 hour GTT, and Incision check 1 week  High risk pregnancy complicated by: GDM Delivery mode:  C-Section, Low Transverse  Anticipated Birth Control:  BTL done PP   Dillinger Aston MD MPH OB Fellow, [place] for [place] 01/10/2022

## 2022-01-08 NOTE — Anesthesia Procedure Notes (Addendum)
Spinal  Patient location during procedure: OB Start time: 01/08/2022 11:42 AM End time: 01/08/2022 11:47 AM Reason for block: surgical anesthesia Staffing Performed: anesthesiologist  Anesthesiologist: Trevor Iha, MD Performed by: Trevor Iha, MD Authorized by: Trevor Iha, MD   Preanesthetic Checklist Completed: patient identified, IV checked, risks and benefits discussed, surgical consent, monitors and equipment checked, pre-op evaluation and timeout performed Spinal Block Patient position: sitting Prep: DuraPrep and site prepped and draped Patient monitoring: heart rate, cardiac monitor, continuous pulse ox and blood pressure Approach: midline Location: L3-4 Injection technique: single-shot Needle Needle type: Pencan  Needle gauge: 24 G Needle length: 10 cm Needle insertion depth: 7 cm Assessment Sensory level: T3 Events: CSF return Additional Notes  1 Attempt (s). Pt tolerated procedure well.

## 2022-01-08 NOTE — Transfer of Care (Signed)
Immediate Anesthesia Transfer of Care Note  Patient: Jean Dougherty  Procedure(s) Performed: CESAREAN SECTION CESAREAN SECTION WITH BILATERAL TUBAL LIGATION  Patient Location: PACU  Anesthesia Type:Spinal  Level of Consciousness: awake, alert  and oriented  Airway & Oxygen Therapy: Patient Spontanous Breathing  Post-op Assessment: Report given to RN and Post -op Vital signs reviewed and stable  Post vital signs: Reviewed and stable  Last Vitals:  Vitals Value Taken Time  BP 118/43 01/08/22 1349  Temp    Pulse 97 01/08/22 1351  Resp 18 01/08/22 1351  SpO2 98 % 01/08/22 1351  Vitals shown include unvalidated device data.  Last Pain:  Vitals:   01/08/22 0941  TempSrc: Oral         Complications: No notable events documented.

## 2022-01-08 NOTE — Op Note (Signed)
Cesarean Section Operative Report  Jean Dougherty  01/08/2022  Indications: history prior cesarean section, undesired fertility  Pre-operative Diagnosis: repeat low transverse cesarean section, bilateral salpingectomy  Post-operative Diagnosis: Same   Surgeon: Surgeon(s) and Role:    * Ajaya Crutchfield, Wilfred Curtis, MD - Primary    * Nobie Putnam, Cyndi Lennert, MD - Assisting   Attending Attestation: I was present and scrubbed for the entire procedure.   An experienced assistant was required given the standard of surgical care given the complexity of the case.  This assistant was needed for exposure, dissection, suctioning, retraction, instrument exchange, assisting with delivery with administration of fundal pressure, and for overall help during the procedure.  Anesthesia: spinal    Quantified Blood Loss: 586 ml  Total IV Fluids: 2500 ml LR  Urine Output:: 400 ml clear yellow urine  Specimens: bilateral fallopian tubes to pathology  Findings: Viable female infant in cephalic presentation; Apgars pending; weight pending; arterial cord pH not obtained;  clear amniotic fluid; intact placenta with three vessel cord; normal uterus, fallopian tubes and ovaries bilaterally.  Baby condition / location:  Couplet care / Skin to Skin   Complications: no complications  Indications: Jean Dougherty is a 31 y.o. [id number] with an IUP [redacted]w[redacted]d presenting for scheduled repeat cesarean section. Declined TOLAC.  The risks, benefits, complications, treatment options, and exected outcomes were discussed with the patient . The patient dwith the proposed plan, giving informed consent. identified as Jean Dougherty and the procedure verified as C-Section Delivery.  Procedure Details:  The patient was taken back to the operative suite where spinal anesthesia was placed.  The patient syncopized on the table shortly after administration of spinal anesthesia. BP revealed hypotension. Phenylephrine and epinephrine administered by  anesthesia team. Symptoms resolve and no recurrence during surgery.  A time out was held and the above information confirmed.   After induction of anesthesia, the patient was draped and prepped in the usual sterile manner and placed in a dorsal supine position with a leftward tilt. The patient's prior skin incision noted to be mid-way between umbilicus and pubic symphisis. Decision made to utilize that former incision. The incision was made and carried down through the subcutaneous tissue to the fascia. Fascial incision was made and sharply extended transversely. The fascia was separated from the underlying rectus tissue superiorly and inferiorly. The peritoneum was identified and bluntly entered and extended longitudinally. Alexis retractor was placed. A bladder flap was created. A low transverse uterine incision was made and extended bluntly. Delivered from cephalic presentation was a viable infant with Apgars and weight as above. There was one loose nuchal that was reduced after delivery.  After waiting 60 seconds for delayed cord cutting, the umbilical cord was clamped and cut cord blood was obtained for evaluation. Cord ph was not sent. The placenta was removed Intact and appeared normal. The uterine outline, tubes and ovaries appeared normal. The uterine incision was closed with running unlocked sutures 0-Vicryl in one layer.   Hemostasis was observed after placement of one 0 vicryl figure of eight suture.  Attention was then turned to the right fallopian tube. Kelly forceps were placed on the mesosalpinx underneath most of the tube.  This pedicle was double suture ligated with 2-0 plain gut, and the tube including the fimbriated end was excised.  The left fallopian tube was then identified, doubly ligated, and was excised in a similar fashion allowing for bilateral tubal sterilization via bilateral salpingectomy.  Good hemostasis was noted overall.  The hysterotomy was inspected. Bovie cautery and  placement of Arista were utilized to control bleeding at serosal edges. Hemostasis was then confirmed.   The peritoneum was closed with 2-0 vicryl in a running fashion. The rectus muscles were examined and hemostasis observed after bovie cautery. The fascia was then reapproximated with running sutures of 0-Vicryl. The subcuticular closure was performed with 2-0 plain gut. The skin was closed with 4-0 Vicryl.  Prevena wound vac was applied.  Instrument, sponge, and needle counts were correct prior the abdominal closure and were correct at the conclusion of the case.     Disposition: PACU - hemodynamically stable.   Maternal Condition: stable       Signed: Lavonne Chick 01/08/2022 1:53 PM

## 2022-01-09 ENCOUNTER — Ambulatory Visit: Payer: Medicaid Other

## 2022-01-09 LAB — CBC
HCT: 30.3 % — ABNORMAL LOW (ref 36.0–46.0)
Hemoglobin: 9.7 g/dL — ABNORMAL LOW (ref 12.0–15.0)
MCH: 26.8 pg (ref 26.0–34.0)
MCHC: 32 g/dL (ref 30.0–36.0)
MCV: 83.7 fL (ref 80.0–100.0)
Platelets: 238 10*3/uL (ref 150–400)
RBC: 3.62 MIL/uL — ABNORMAL LOW (ref 3.87–5.11)
RDW: 17.3 % — ABNORMAL HIGH (ref 11.5–15.5)
WBC: 6.4 10*3/uL (ref 4.0–10.5)
nRBC: 0 % (ref 0.0–0.2)

## 2022-01-09 LAB — BIRTH TISSUE RECOVERY COLLECTION (PLACENTA DONATION)

## 2022-01-09 LAB — GLUCOSE, RANDOM: Glucose, Bld: 103 mg/dL — ABNORMAL HIGH (ref 70–99)

## 2022-01-09 MED ORDER — FERROUS SULFATE 325 (65 FE) MG PO TABS
325.0000 mg | ORAL_TABLET | ORAL | Status: DC
  Administered 2022-01-09: 325 mg via ORAL
  Filled 2022-01-09: qty 1

## 2022-01-09 NOTE — Progress Notes (Signed)
POSTPARTUM PROGRESS NOTE  POD #1  Subjective:  Jean Dougherty is a 30 y.o. [id number] s/p rLTCS at [redacted]w[redacted]d. No acute events overnight. She reports she is doing well. She denies any problems with ambulating, voiding or po intake. Denies nausea or vomiting. She has passed flatus. Pain is well controlled.  Lochia is adequate.  Objective: Blood pressure (!) 107/55, pulse 95, temperature 98.2 F (36.8 C), temperature source Oral, resp. rate 16, height 5\' 4"  (1.626 m), weight 133.8 kg, last menstrual period 04/23/2021, SpO2 99 %, unknown if currently breastfeeding.  Physical Exam:  General: alert, cooperative and no distress Chest: no respiratory distress Heart: regular rate, distal pulses intact Uterine Fundus: firm, appropriately tender DVT Evaluation: No calf swelling or tenderness Extremities: mild edema Skin: warm, dry; incision clean/dry/intact w/ honeycomb dressing in place. Prevena also in place.  Recent Labs    01/07/22 1030 01/09/22 0518  HGB 11.0* 9.7*  HCT 33.7* 30.3*    Assessment/Plan: Jean Dougherty is a 31 y.o. 26 s/p rLTCS/BS at [redacted]w[redacted]d for elective repeat.  POD#1 - Doing welll; pain is controlled. H/H appropriate  Routine postpartum care  OOB, ambulated  Lovenox for VTE prophylaxis Anemia: asymptomatic  Start po ferrous sulfate every other day Contraception: Bilateral salpingectomy Feeding: Both  Dispo: Plan for discharge tomorrow or day after.   LOS: 1 day   [redacted]w[redacted]d, DO OB Fellow  01/09/2022, 8:02 AM
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
## 2022-01-10 MED ORDER — OXYCODONE HCL 5 MG PO TABS: 5.0000 mg | ORAL_TABLET | Freq: Four times a day (QID) | ORAL | 0 refills | Status: AC | PRN

## 2022-01-10 MED ORDER — PANTOPRAZOLE SODIUM 40 MG PO TBEC
40.0000 mg | DELAYED_RELEASE_TABLET | Freq: Every day | ORAL | Status: DC
  Administered 2022-01-10: 40 mg via ORAL
  Filled 2022-01-10: qty 1

## 2022-01-10 MED ORDER — IBUPROFEN 600 MG PO TABS: 600.0000 mg | ORAL_TABLET | Freq: Four times a day (QID) | ORAL | 0 refills | Status: AC | PRN

## 2022-01-10 NOTE — Discharge Instructions (Signed)
Follow up in 1 week for incision check  2.  Follow-up outpatient in 4 to 6 weeks, as scheduled for your postpartum visit.    3.  Take Tylenol 1000 mg and ibuprofen 600 mg scheduled times a day for the next few days, to help with your pain.  You can also take oxycodone as needed for breakthrough pain.  4.  Please call if you start to experience increased vaginal bleeding requiring a change of pads every 2 hours, sudden onset severe headache, right abdominal pain, flashes of light in a vision or any concerns for new elevated blood pressure.

## 2022-01-10 NOTE — Lactation Note (Signed)
This note was copied from a baby's chart. Lactation Consultation Note  Patient Name: Boy Marty Sadlowski [id number] Date: 01/10/2022 Reason for consult: Follow-up assessment;Early term 37-38.6wks;Infant weight loss (-3% weight loss) Age:31 hours Birth Parent requested LC services tonight to assist with latching infant at the breast.  Birth Parent current feeding choice is breastfeeding and supplementing with formula. Birth Parent latched infant on her left breast infant was on and off breast for 10 minutes afterwards infant was supplemented with 10 mls of formula. Birth Parent will continue to work towards latching infant at the breast for every feeding before supplementing with formula. Birth Parent will continue to BF infant according to hunger cues, on demand, 8 to 12+ times within 24 hours, STS. Birth Parent will continue to ask RN/LC for further latch assistance if needed.  Maternal Data    Feeding Mother's Current Feeding Choice: Breast Milk and Formula Nipple Type: Slow - flow  LATCH Score Latch: Repeated attempts needed to sustain latch, nipple held in mouth throughout feeding, stimulation needed to elicit sucking reflex.  Audible Swallowing: A few with stimulation  Type of Nipple: Inverted  Comfort (Breast/Nipple): Soft / non-tender  Hold (Positioning): Assistance needed to correctly position infant at breast and maintain latch.  LATCH Score: 5   Lactation Tools Discussed/Used    Interventions Interventions: Assisted with latch;Skin to skin;Support pillows;Adjust position;Breast compression;Position options;Education;Reverse pressure;LC Services brochure  Discharge    Consult Status Consult Status: Follow-up Date: 01/10/22 Follow-up type: In-patient    Danelle Earthly 01/10/2022, 2:50 AM

## 2022-01-13 LAB — SURGICAL PATHOLOGY

## 2022-01-14 ENCOUNTER — Encounter: Payer: Medicaid Other | Admitting: Advanced Practice Midwife

## 2022-01-14 ENCOUNTER — Encounter: Payer: Medicaid Other | Admitting: Obstetrics & Gynecology

## 2022-01-15 ENCOUNTER — Ambulatory Visit (INDEPENDENT_AMBULATORY_CARE_PROVIDER_SITE_OTHER): Payer: Medicaid Other | Admitting: General Practice

## 2022-01-15 DIAGNOSIS — Z5189 Encounter for other specified aftercare: Secondary | ICD-10-CM

## 2022-01-15 NOTE — Progress Notes (Cosign Needed Addendum)
The wound is cleansed, debrided of foreign material as much as possible, and dressed. Wound vac removed. The patient is alerted to watch for any signs of infection (redness, pus, pain, increased swelling or fever) and call if such occurs. Home wound care instructions are provided. Pt verbalized understanding. Tetanus vaccination status reviewed: last tetanus booster within 10 years.   ________________  Patient was assessed and managed by nursing staff during this encounter. I have reviewed the chart and agree with the documentation and plan. I have also made any necessary editorial changes.  Lahoma Crocker Mercado-Ortiz, DO 01/17/2022 6:21 AM

## 2022-01-21 ENCOUNTER — Encounter: Payer: Medicaid Other | Admitting: Student

## 2022-01-21 ENCOUNTER — Encounter: Payer: Medicaid Other | Admitting: Obstetrics and Gynecology

## 2022-01-28 ENCOUNTER — Encounter: Payer: Medicaid Other | Admitting: Obstetrics and Gynecology

## 2022-02-19 ENCOUNTER — Other Ambulatory Visit: Payer: Medicaid Other

## 2022-02-19 ENCOUNTER — Ambulatory Visit: Payer: Medicaid Other | Admitting: Obstetrics and Gynecology

## 2022-04-16 ENCOUNTER — Encounter: Payer: Self-pay | Admitting: *Deleted

## 2022-06-29 ENCOUNTER — Encounter: Payer: Self-pay | Admitting: Obstetrics and Gynecology

## 2022-07-03 ENCOUNTER — Telehealth: Payer: Self-pay | Admitting: Obstetrics and Gynecology

## 2022-07-03 NOTE — Telephone Encounter (Signed)
Patient returned call to discuss her concerns around her tubal ligation.  Patient states that Dr. Si Raider counseled her on the procedure and stated it would be clips and that she could have those removed at a later date if she wanted to have more children.   I did explain that we do counsel all patients in the office that desire BTL that it is considered a permanent sterilization.    I have forwarded patients concerns to our grievance team and they will reach out to her.    I did provide patient with the number/contact info to our medical records team so she can obtain the copies of records she is seeking.
